# Patient Record
Sex: Female | Born: 1975
Health system: Southern US, Community
[De-identification: ages and names within clinical notes are randomized; demographics above are authoritative.]

## PROBLEM LIST (undated history)

## (undated) DIAGNOSIS — F32A Depression, unspecified: Secondary | ICD-10-CM

## (undated) DIAGNOSIS — F329 Major depressive disorder, single episode, unspecified: Secondary | ICD-10-CM

## (undated) DIAGNOSIS — J45990 Exercise induced bronchospasm: Secondary | ICD-10-CM

## (undated) DIAGNOSIS — F17200 Nicotine dependence, unspecified, uncomplicated: Secondary | ICD-10-CM

## (undated) DIAGNOSIS — M1990 Unspecified osteoarthritis, unspecified site: Secondary | ICD-10-CM

## (undated) DIAGNOSIS — G43109 Migraine with aura, not intractable, without status migrainosus: Secondary | ICD-10-CM

## (undated) DIAGNOSIS — IMO0002 Reserved for concepts with insufficient information to code with codable children: Secondary | ICD-10-CM

## (undated) HISTORY — DX: Nicotine dependence, unspecified, uncomplicated: F17.200

## (undated) HISTORY — DX: Depression, unspecified: F32.A

## (undated) HISTORY — DX: Reserved for concepts with insufficient information to code with codable children: IMO0002

## (undated) HISTORY — DX: Major depressive disorder, single episode, unspecified: F32.9

## (undated) HISTORY — PX: TOTAL HIP ARTHROPLASTY: SHX124

## (undated) HISTORY — DX: Exercise induced bronchospasm: J45.990

## (undated) HISTORY — DX: Migraine with aura, not intractable, without status migrainosus: G43.109

---

## 2010-03-02 ENCOUNTER — Emergency Department (HOSPITAL_COMMUNITY)
Admission: EM | Admit: 2010-03-02 | Discharge: 2010-03-02 | Payer: Self-pay | Source: Home / Self Care | Admitting: Emergency Medicine

## 2010-05-14 LAB — POCT I-STAT, CHEM 8
Chloride: 109 mEq/L (ref 96–112)
Glucose, Bld: 101 mg/dL — ABNORMAL HIGH (ref 70–99)
HCT: 43 % (ref 36.0–46.0)
Potassium: 4 mEq/L (ref 3.5–5.1)

## 2010-05-14 LAB — URINALYSIS, ROUTINE W REFLEX MICROSCOPIC
Bilirubin Urine: NEGATIVE
Nitrite: NEGATIVE
Specific Gravity, Urine: 1.018 (ref 1.005–1.030)
pH: 7.5 (ref 5.0–8.0)

## 2010-05-14 LAB — POCT PREGNANCY, URINE: Preg Test, Ur: NEGATIVE

## 2010-11-28 HISTORY — PX: COLPOSCOPY: SHX161

## 2011-05-02 ENCOUNTER — Emergency Department (HOSPITAL_COMMUNITY)
Admission: EM | Admit: 2011-05-02 | Discharge: 2011-05-02 | Disposition: A | Discharge status: Home / Self Care | Payer: 59 | Attending: Emergency Medicine | Admitting: Emergency Medicine

## 2011-05-02 ENCOUNTER — Encounter (HOSPITAL_COMMUNITY): Payer: Self-pay | Admitting: Emergency Medicine

## 2011-05-02 DIAGNOSIS — S025XXA Fracture of tooth (traumatic), initial encounter for closed fracture: Secondary | ICD-10-CM

## 2011-05-02 DIAGNOSIS — S01501A Unspecified open wound of lip, initial encounter: Secondary | ICD-10-CM | POA: Insufficient documentation

## 2011-05-02 DIAGNOSIS — S01511A Laceration without foreign body of lip, initial encounter: Secondary | ICD-10-CM

## 2011-05-02 DIAGNOSIS — W1809XA Striking against other object with subsequent fall, initial encounter: Secondary | ICD-10-CM | POA: Insufficient documentation

## 2011-05-02 DIAGNOSIS — S01502A Unspecified open wound of oral cavity, initial encounter: Secondary | ICD-10-CM | POA: Insufficient documentation

## 2011-05-02 DIAGNOSIS — S01512A Laceration without foreign body of oral cavity, initial encounter: Secondary | ICD-10-CM

## 2011-05-02 MED ORDER — AMOXICILLIN-POT CLAVULANATE 875-125 MG PO TABS: 1.0000 | ORAL_TABLET | Freq: Once | ORAL | Status: AC

## 2011-05-02 MED ORDER — HYDROCODONE-ACETAMINOPHEN 5-500 MG PO TABS: 1.0000 | ORAL_TABLET | Freq: Four times a day (QID) | ORAL | Status: DC | PRN

## 2011-05-02 MED ORDER — AMOXICILLIN-POT CLAVULANATE 875-125 MG PO TABS
1.0000 | ORAL_TABLET | ORAL | Status: AC
  Administered 2011-05-02: 1 via ORAL
  Filled 2011-05-02: qty 1

## 2011-05-02 MED ORDER — OXYCODONE-ACETAMINOPHEN 5-325 MG PO TABS: 1.0000 | ORAL_TABLET | Freq: Four times a day (QID) | ORAL | Status: AC | PRN
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
## 2011-05-02 NOTE — ED Notes (Signed)
Pt states she understands discharge instructions. Pt cautioned about driving under influence of percocet

## 2011-05-02 NOTE — ED Provider Notes (Signed)
Medical screening examination/treatment/procedure(s) were performed by non-physician practitioner and as supervising physician I was immediately available for consultation/collaboration.   Laray Anger, DO 05/02/11 1629

## 2011-05-02 NOTE — Discharge Instructions (Signed)
Absorbable Suture Repair Absorbable sutures (stitches) hold skin together so you can heal. Keep skin wounds clean and dry for the next 2 to 3 days. Then, you may gently wash your wound and dress it with an antibiotic ointment as recommended. As your wound begins to heal, the sutures are no longer needed, and they typically begin to fall off. This will take 7 to 10 days. After 10 days, if your sutures are loose, you can remove them by wiping with a clean gauze pad or a cotton ball. Do not pull your sutures out. They should wipe away easily. If after 10 days they do not easily wipe away, have your caregiver take them out. Absorbable sutures may be used deep in a wound to help hold it together. If these stitches are below the skin, the body will absorb them completely in 3 to 4 weeks.  You may need a tetanus shot if:  You cannot remember when you had your last tetanus shot.   You have never had a tetanus shot.  If you get a tetanus shot, your arm may swell, get red, and feel warm to the touch. This is common and not a problem. If you need a tetanus shot and you choose not to have one, there is a rare chance of getting tetanus. Sickness from tetanus can be serious. SEEK IMMEDIATE MEDICAL CARE IF:  You have redness in the wound area.   The wound area feels hot to the touch.   You develop swelling in the wound area.   You develop pain.   There is fluid drainage from the wound.  Document Released: 03/28/2004 Document Revised: 10/31/2010 Document Reviewed: 07/10/2010 [facility] Patient Information 2012 [place], [place].Dental Fracture You have a dental fracture or injury. This can mean the tooth is loose, has a chip in the enamel or is broken. If just the outer enamel is chipped, there is a good chance the tooth will not become infected. The only treatment needed may be to smooth off a rough edge. Fractures into the deeper layers (dentin and pulp) cause greater pain and are more likely to become infected.  These require you to see a dentist as soon as possible to save the tooth. Loose teeth may need to be wired or bonded with a plastic splint to hold them in place. A paste may be painted on the open area of the broken tooth to reduce the pain. Antibiotics and pain medicine may be prescribed. Choosing a soft or liquid diet and rinsing the mouth out with warm water after meals may be helpful. See your dentist as recommended. Failure to seek care or follow up with a dentist or other specialist as recommended could result in the loss of your tooth, infection, or permanent dental problems. SEEK MEDICAL CARE IF:   You have increased pain not controlled with medicines.   You have swelling around the tooth, in the face or neck.   You have bleeding which starts, continues, or gets worse.   You have a fever.  Document Released: 03/28/2004 Document Revised: 10/31/2010 Document Reviewed: 01/10/2009 [facility] Patient Information 2012 [place], [place].

## 2012-03-04 DIAGNOSIS — F419 Anxiety disorder, unspecified: Secondary | ICD-10-CM

## 2012-03-04 HISTORY — DX: Anxiety disorder, unspecified: F41.9

## 2012-11-17 ENCOUNTER — Encounter: Payer: Self-pay | Admitting: Gynecology

## 2012-11-18 ENCOUNTER — Encounter: Payer: Self-pay | Admitting: Gynecology

## 2012-11-23 ENCOUNTER — Ambulatory Visit (INDEPENDENT_AMBULATORY_CARE_PROVIDER_SITE_OTHER): Payer: 59 | Admitting: Certified Nurse Midwife

## 2012-11-23 ENCOUNTER — Encounter: Payer: Self-pay | Admitting: Gynecology

## 2012-11-23 ENCOUNTER — Ambulatory Visit: Payer: Self-pay | Admitting: Gynecology

## 2012-11-23 VITALS — BP 110/68 | HR 66 | Resp 14 | Ht 67.25 in | Wt 155.0 lb

## 2012-11-23 DIAGNOSIS — Z8742 Personal history of other diseases of the female genital tract: Secondary | ICD-10-CM

## 2012-11-23 DIAGNOSIS — Z87898 Personal history of other specified conditions: Secondary | ICD-10-CM

## 2012-11-23 DIAGNOSIS — Z0000 Encounter for general adult medical examination without abnormal findings: Secondary | ICD-10-CM

## 2012-11-23 DIAGNOSIS — Z01419 Encounter for gynecological examination (general) (routine) without abnormal findings: Secondary | ICD-10-CM

## 2012-11-23 DIAGNOSIS — R350 Frequency of micturition: Secondary | ICD-10-CM

## 2012-11-23 LAB — POCT URINALYSIS DIPSTICK: Urobilinogen, UA: NEGATIVE

## 2012-11-23 NOTE — Patient Instructions (Signed)

## 2012-11-23 NOTE — Progress Notes (Signed)
37 y.o. [id number] Single Hispanic Fe here for annual exam. Periods normal, no issues. Currently not sexual active. Desires STD screening from last partner. No health issues today. Being treated for bulging discs at present. Complaining of urinary frequency that has increased over the past few months. Denies pain with urination. 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 Also complaining of bump that reoccurs with questionable blister appearance in pubic area. Becomes sore and tender and burns, not present today. No history of herpes that she is aware of. Sees PCP for aex and labs. No other health issues today.  Patient's last menstrual period was 11/10/2012.          Sexually active: yes  The current method of family planning is condoms most of the time.    Exercising: yes  walking regularly Smoker:  no  Health Maintenance: Pap:  11/01/11 abnormal pap LSIL with colpo CIN1 MMG:  none Colonoscopy:  none BMD:   none TDaP:  04/23/2010 Labs: PCP ; Urine: Leuks 2   reports that she has never smoked. She has never used smokeless tobacco. She reports that  drinks alcohol. She reports that she does not use illicit drugs.  Past Medical History  Diagnosis Date  . Migraine with aura   . ASCUS with positive high risk HPV   . Depression   . Smoker     Past Surgical History  Procedure Laterality Date  . Colposcopy  11/28/10    With hx CIN I     Current Outpatient Prescriptions  Medication Sig Dispense Refill  . BIOTIN PO Take by mouth.      . meloxicam (MOBIC) 15 MG tablet Take 15 mg by mouth daily.      . Multiple Vitamins-Minerals (MULTIVITAMIN WITH MINERALS) tablet Take 1 tablet by mouth daily.      Marland Kitchen MELATONIN PO Take by mouth as needed.        No current facility-administered medications for this visit.    Family History  Problem Relation Age of Onset  . Migraines Maternal Grandmother   . Heart disease Maternal Grandmother   . Hodgkin's lymphoma Maternal Grandfather     ROS:   Pertinent items are noted in HPI.  Otherwise, a comprehensive ROS was negative.  Exam:   BP 110/68  Pulse 66  Resp 14  Ht 5' 7.25" (1.708 m)  Wt 155 lb (70.308 kg)  BMI 24.1 kg/m2  LMP 11/10/2012 Height: 5' 7.25" (170.8 cm)  Ht Readings from Last 3 Encounters:  11/23/12 5' 7.25" (1.708 m)    General appearance: alert, cooperative and appears stated age Head: Normocephalic, without obvious abnormality, atraumatic Neck: no adenopathy, supple, symmetrical, trachea midline and thyroid normal to inspection and palpation Lungs: clear to auscultation bilaterally, CVAT negative Breasts: normal appearance, no masses or tenderness, No nipple retraction or dimpling, No nipple discharge or bleeding, No axillary or supraclavicular adenopathy Heart: regular rate and rhythm Abdomen: soft, non-tender; no masses,  no organomegaly, negative suprapubic Extremities: extremities normal, atraumatic, no cyanosis or edema Skin: Skin color, texture, turgor normal. No rashes or lesions Lymph nodes: Cervical, supraclavicular, and axillary nodes normal. No abnormal inguinal nodes palpated Neurologic: Grossly normal   Pelvic: External genitalia:  no lesions or blisters noted              Urethra:  normal appearing urethra with no masses, tenderness or lesions, bladder non tender              Bartholin's and Skene's: normal  Vagina: normal appearing vagina with normal color and discharge, no lesions              Cervix: normal, non tender              Pap taken: yes Bimanual Exam:  Uterus:  normal size, contour, position, consistency, mobility, non-tender and anteflexed              Adnexa: normal adnexa and no mass, fullness, tenderness               Rectovaginal: Confirms               Anus:  normal sphincter tone, no lesions  A:  Well Woman with normal exam  Contraception: condoms  History of abnormal Pap smear LSIL/colpo/CIN1 follow up pap today  STD screening  Urinary frequency    Questionable Herpes blister vs ingrown hair  P:   Reviewed health and wellness pertinent to exam  Stressed consistent use  Stressed aex. If pap negative will need repeat next year, if not negative will manage per findings  Lab:GC,Chlamydia,HIV,RPR,HSV1,11   Encourage water intake daily, given warning sign of UTI, Lab: Urine microscopic, culture  Recommended OV when present when occurs  Pap smear as per guidelines   Mammogram yearly at age 30 pap smear taken today with HPVHR  counseled on breast self exam, mammography screening, adequate intake of calcium and vitamin D, diet and exercise, Kegel's exercises  return annually or prn  An After Visit Summary was printed and given to the patient.

## 2012-11-24 LAB — URINALYSIS, MICROSCOPIC ONLY: Crystals: NONE SEEN

## 2012-11-24 NOTE — Progress Notes (Signed)
Note reviewed, agree with plan.  Ashland Wiseman, MD  

## 2012-11-25 LAB — IPS PAP TEST WITH HPV

## 2012-11-25 LAB — IPS N GONORRHOEA AND CHLAMYDIA BY PCR

## 2012-11-26 ENCOUNTER — Telehealth: Payer: Self-pay | Admitting: Gynecology

## 2012-11-26 ENCOUNTER — Other Ambulatory Visit: Payer: Self-pay | Admitting: Certified Nurse Midwife

## 2012-11-26 DIAGNOSIS — IMO0001 Reserved for inherently not codable concepts without codable children: Secondary | ICD-10-CM

## 2012-12-01 ENCOUNTER — Telehealth: Payer: Self-pay | Admitting: Orthopedic Surgery

## 2012-12-01 NOTE — Telephone Encounter (Signed)
If she is already on an NSAID she should be fine. Be sure she eats and drinks something prior to appointment. Tylenol would be OK also

## 2012-12-01 NOTE — Telephone Encounter (Signed)
LMTCB for results. aa 

## 2012-12-01 NOTE — Telephone Encounter (Signed)
Spoke with pt about Pap results and need for colposcopy. Pt familiar with procedure and agreeable. Advised OOP cost will be $361.59. Pt agreeable. Pt also inquired about other lab results from last visit, specifically HSV. 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 Pt agreeable to talk with DL at appt about options for treatment. Advised pt to take ibuprofen an hour before appt, and pt reported she cannot take ibuprofen due to another anti-inflammatory med that she takes. Pt wondering if there is something else she should take prior to appt, as the last colpo was "pretty uncomfortable." Any advice?

## 2012-12-04 ENCOUNTER — Ambulatory Visit (INDEPENDENT_AMBULATORY_CARE_PROVIDER_SITE_OTHER): Payer: 59 | Admitting: Certified Nurse Midwife

## 2012-12-04 ENCOUNTER — Telehealth: Payer: Self-pay | Admitting: Orthopedic Surgery

## 2012-12-04 VITALS — BP 104/66 | HR 64 | Resp 16 | Ht 67.25 in | Wt 156.0 lb

## 2012-12-04 DIAGNOSIS — Z789 Other specified health status: Secondary | ICD-10-CM

## 2012-12-04 DIAGNOSIS — IMO0001 Reserved for inherently not codable concepts without codable children: Secondary | ICD-10-CM

## 2012-12-04 DIAGNOSIS — Z308 Encounter for other contraceptive management: Secondary | ICD-10-CM

## 2012-12-04 DIAGNOSIS — R6889 Other general symptoms and signs: Secondary | ICD-10-CM

## 2012-12-04 MED ORDER — VALACYCLOVIR HCL 500 MG PO TABS: 500.0000 mg | ORAL_TABLET | Freq: Every day | ORAL | Status: DC

## 2012-12-04 NOTE — Progress Notes (Signed)
Patient ID: Michelle Ewing, female   DOB: 1975/10/21, 37 y.o.   MRN: [id number]  Chief Complaint  Patient presents with  . Colposcopy    HPI Michelle Ewing is a 37 y.o. female.  Here for colposcopy . Denies vaginal discharge or bleeding. Not sexually active at present. LMP 11/10/12. Also here for discussion of HSV2 treatment due to recent positive serum results and recent outbreak in [place] area. No other health issues. HPI  Indications: Pap smear on November 23, 2012 showed: ASCUS with NEGATIVE high risk HPV. Previous colposcopy: CIN 1 and in 11/18/11. Prior cervical treatment: no treatment.  Past Medical History  Diagnosis Date  . Migraine with aura   . ASCUS with positive high risk HPV   . Depression   . Smoker     Past Surgical History  Procedure Laterality Date  . Colposcopy  11/28/10    With hx CIN I     Family History  Problem Relation Age of Onset  . Migraines Maternal Grandmother   . Heart disease Maternal Grandmother   . Hodgkin's lymphoma Maternal Grandfather     Social History History  Substance Use Topics  . Smoking status: Never Smoker   . Smokeless tobacco: Never Used  . Alcohol Use: Yes     Comment: occ    No Known Allergies  Current Outpatient Prescriptions  Medication Sig Dispense Refill  . BIOTIN PO Take by mouth daily.       Marland Kitchen MELATONIN PO Take by mouth as needed.       . meloxicam (MOBIC) 15 MG tablet Take 15 mg by mouth daily.      . Multiple Vitamins-Minerals (MULTIVITAMIN WITH MINERALS) tablet Take 1 tablet by mouth daily.       No current facility-administered medications for this visit.    Review of Systems Review of Systems  Constitutional: Negative.   Genitourinary: Negative for vaginal bleeding, vaginal discharge and vaginal pain.    Blood pressure 104/66, pulse 64, resp. rate 16, height 5' 7.25" (1.708 m), weight 156 lb (70.761 kg), last menstrual period 11/10/2012.  Physical Exam Physical Exam  Constitutional: She is oriented to  person, place, and time. She appears well-developed and well-nourished.  Genitourinary: Vagina normal and uterus normal.    Neurological: She is alert and oriented to person, place, and time.  Skin: Skin is warm and dry.  Psychiatric: She has a normal mood and affect.    Data Reviewed Reviewed Pap smear results with patient and discussed HSV 2 results. Discussed suppression medication use for frequent outbreak. Risks and benefits given. Etiology of transmission given requests Rx for HSV2.  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  Speculum placed in vagina and excellent visualization of cervix achieved, cervix swabbed x 3 with acetic acid solution. Cervix viewed with 3.75,7.5, 15 # and green filter with acetowhite effect noted at 9 o'clock. Lugol's solution applied with same area noted with non staining. Biopsy taken from same area. ECC obtained. Monsel's applied to area, no active bleeding noted on speculum removal. Patient tolerated procedure well. Specimens: 2  Complications: none.     Plan  Instructions given to patient. Rx Valtrex see order with instructions given to patient  Patient will keep record of outbreak history over the next few months. Encouraged good diet with folic acid which has shown in some studies to  increase immune status.  Specimens labelled and sent to Pathology. Patient will be notified of results when received.      LEONARD,DEBORAH 12/04/2012, 2:36 PM

## 2012-12-04 NOTE — Progress Notes (Signed)
Hx of ASCUS HPV not detected on pap 11/23/12 CIN1 on previous colpo 11-28-10

## 2012-12-04 NOTE — Telephone Encounter (Signed)
Spoke with pt to advise that she can take Tylenol today prior to her appt for colpo. Pt instructed to eat and drink before coming in as well. Pt agreeable.

## 2012-12-04 NOTE — Patient Instructions (Addendum)

## 2012-12-08 NOTE — Progress Notes (Signed)
(  A) CERVIX, "9:00", BIOPSY: -MILD CYTOLOGIC ATYPIA, CONSISTENT WITH LOW GRADE SQUAMOUS INTRAEPITHELIAL LESION (B) ENDOCERVIX, CURETTINGS: -MUCOID DEBRIS WITH BENIGN ENDOCERVICAL GLANDULAR EPITHELIAL CELLS; NEGATIVE FOR ATYPIA OR MALIGNANCY   Note reviewed, agree with plan.  Douglass Rivers, MD

## 2013-01-07 ENCOUNTER — Other Ambulatory Visit: Payer: Self-pay

## 2013-12-07 ENCOUNTER — Ambulatory Visit: Payer: 59 | Admitting: Certified Nurse Midwife

## 2013-12-09 ENCOUNTER — Other Ambulatory Visit: Payer: Self-pay | Admitting: Certified Nurse Midwife

## 2013-12-09 NOTE — Telephone Encounter (Signed)
Last AEX: 11/23/12 Last refill: 12/04/12 #30 X 12 Current AEX:12/15/13  Please advise

## 2013-12-15 ENCOUNTER — Ambulatory Visit: Payer: 59 | Admitting: Certified Nurse Midwife

## 2014-01-03 ENCOUNTER — Encounter: Payer: Self-pay | Admitting: Gynecology

## 2014-01-11 ENCOUNTER — Ambulatory Visit: Payer: 59 | Admitting: Certified Nurse Midwife

## 2014-02-22 ENCOUNTER — Ambulatory Visit (INDEPENDENT_AMBULATORY_CARE_PROVIDER_SITE_OTHER): Payer: PRIVATE HEALTH INSURANCE | Admitting: Certified Nurse Midwife

## 2014-02-22 ENCOUNTER — Encounter: Payer: Self-pay | Admitting: Certified Nurse Midwife

## 2014-02-22 VITALS — BP 108/62 | HR 68 | Resp 16 | Ht 67.25 in | Wt 160.0 lb

## 2014-02-22 DIAGNOSIS — Z01419 Encounter for gynecological examination (general) (routine) without abnormal findings: Secondary | ICD-10-CM

## 2014-02-22 DIAGNOSIS — Z0000 Encounter for general adult medical examination without abnormal findings: Secondary | ICD-10-CM

## 2014-02-22 DIAGNOSIS — N943 Premenstrual tension syndrome: Secondary | ICD-10-CM

## 2014-02-22 DIAGNOSIS — B373 Candidiasis of vulva and vagina: Secondary | ICD-10-CM

## 2014-02-22 DIAGNOSIS — Z124 Encounter for screening for malignant neoplasm of cervix: Secondary | ICD-10-CM

## 2014-02-22 DIAGNOSIS — B3731 Acute candidiasis of vulva and vagina: Secondary | ICD-10-CM

## 2014-02-22 LAB — POCT URINALYSIS DIPSTICK
Bilirubin, UA: NEGATIVE
Blood, UA: NEGATIVE
GLUCOSE UA: NEGATIVE
Ketones, UA: NEGATIVE
Leukocytes, UA: NEGATIVE
NITRITE UA: NEGATIVE
PROTEIN UA: NEGATIVE
UROBILINOGEN UA: NEGATIVE
pH, UA: 5

## 2014-02-22 MED ORDER — NYSTATIN 100000 UNIT/GM EX CREA: 1.0000 "application " | TOPICAL_CREAM | Freq: Two times a day (BID) | CUTANEOUS | Status: DC

## 2014-02-22 NOTE — Progress Notes (Signed)
38 y.o. [id number] Single Caucasian Fe here for annual exam. Periods normal, no issues. Contraception is abstinence. No STD screening. Complaining of area on external vulva area on right that causes periodic itching. Works out hard with exercise and is changing out of clothes sooner now and has changed to [employer] soap. No HSV outbreaks in the past 6 months. Has stopped suppression use now. Patient has noticed increase PMS since she turned 38 with mainly irritability until menses starts, then resolves. Craving sweet/salty foods. Drinking very little water also during that time. Suggestions?? Sees Urgent care if needed. No other health issues today.  Patient's last menstrual period was 02/17/2014.          Sexually active: Yes.    Not in past 4 months The current method of family planning is abstinence.    Exercising: Yes.    gym Smoker:  no  Health Maintenance: Pap:  11-23-12 ASCUS HPV HRneg, colpo 12-04-12 mild atypia consistent with LGSIL MMG:  none Colonoscopy:  none BMD:   none TDaP:  2012 Labs: Poct urine-neg Self breast exam: done occ   reports that she has never smoked. She has never used smokeless tobacco. She reports that she drinks about 2.4 - 3.0 oz of alcohol per week. She reports that she does not use illicit drugs.  Past Medical History  Diagnosis Date  . Migraine with aura   . ASCUS with positive high risk HPV   . Depression   . Smoker     Past Surgical History  Procedure Laterality Date  . Colposcopy  11/28/10    With hx CIN I     Current Outpatient Prescriptions  Medication Sig Dispense Refill  . BIOTIN PO Take by mouth daily.     . folic acid (FOLVITE) 400 MCG tablet Take 400 mcg by mouth daily.    Chilton Si. Green Tea, Camillia sinensis, (GREEN TEA EXTRACT PO) Take 315 mg by mouth daily.    . Multiple Vitamins-Minerals (MULTIVITAMIN WITH MINERALS) tablet Take 1 tablet by mouth daily.    Marland Kitchen. PROAIR HFA 108 (90 BASE) MCG/ACT inhaler   2  . valACYclovir (VALTREX) 500 MG tablet TAKE  1 TABLET (500 MG TOTAL) BY MOUTH DAILY. INCREASE TO TWICE A DAY X 3 DAYS AT ONSET 30 tablet 0   No current facility-administered medications for this visit.    Family History  Problem Relation Age of Onset  . Migraines Maternal Grandmother   . Heart disease Maternal Grandmother   . Hodgkin's lymphoma Maternal Grandfather     ROS:  Pertinent items are noted in HPI.  Otherwise, a comprehensive ROS was negative.  Exam:   BP 108/62 mmHg  Pulse 68  Resp 16  Ht 5' 7.25" (1.708 m)  Wt 160 lb (72.576 kg)  BMI 24.88 kg/m2  LMP 02/17/2014 Height: 5' 7.25" (170.8 cm)  Ht Readings from Last 3 Encounters:  02/22/14 5' 7.25" (1.708 m)  12/04/12 5' 7.25" (1.708 m)  11/23/12 5' 7.25" (1.708 m)    General appearance: alert, cooperative and appears stated age Head: Normocephalic, without obvious abnormality, atraumatic Neck: no adenopathy, supple, symmetrical, trachea midline and thyroid normal to inspection and palpation Lungs: clear to auscultation bilaterally Breasts: normal appearance, no masses or tenderness, No nipple retraction or dimpling, No nipple discharge or bleeding, No axillary or supraclavicular adenopathy Heart: regular rate and rhythm Abdomen: soft, non-tender; no masses,  no organomegaly Extremities: extremities normal, atraumatic, no cyanosis or edema Skin: Skin color, texture, turgor normal. No rashes or  lesions Lymph nodes: Cervical, supraclavicular, and axillary nodes normal. No abnormal inguinal nodes palpated Neurologic: Grossly normal   Pelvic: External genitalia:  no lesions, right labia red, slight scaling, no exudate wet prep taken,left labia WNL,               Urethra:  normal appearing urethra with no masses, tenderness or lesions              Bartholin's and Skene's: normal                 Vagina: normal appearing vagina with normal color and discharge, no lesions, no odor              Cervix: normal,non tender, no lesions              Pap taken: Yes.    Bimanual Exam:  Uterus:  normal size, contour, position, consistency, mobility, non-tender and anteverted              Adnexa: normal adnexa and no mass, fullness, tenderness               Rectovaginal: Confirms               Anus:  normal sphincter tone, no lesions  Wet prep positive for yeast  A:  Well Woman with normal exam  Contraception none needed  History of abnormal pap LSIL on colpo repeat pap today. If negative repeat pap one year, if not per results.  PMS  Yeast dermatitis     P: Reviewed health and wellness pertinent to exam  Discussed increasing water intake, decreasing sugar and salt during time prior to period to decrease fluid retention and nervous irritability. Start on B complex one week before symptoms usually start. Stop once period start. Questions addressed.  Reviewed findings of yeast,which may be coming from work out perspiration. Decrease time in wet clothes and can apply moisturizer to area for protection. Questions addressed. Aveeno sitz bath for comfort  Rx Nystatin cream see order.  Pap smear taken today with HPVHR   counseled on breast self exam, mammography screening discussed and can begin now at 38, given information to schedule, STD prevention, HIV risk factors and prevention, adequate intake of calcium and vitamin D, diet and exercise  return annually or prn  An After Visit Summary was printed and given to the patient.

## 2014-02-22 NOTE — Patient Instructions (Addendum)
EXERCISE AND DIET:  We recommended that you start or continue a regular exercise program for good health. Regular exercise means any activity that makes your heart beat faster and makes you sweat.  We recommend exercising at least 30 minutes per day at least 3 days a week, preferably 4 or 5.  We also recommend a diet low in fat and sugar.  Inactivity, poor dietary choices and obesity can cause diabetes, heart attack, stroke, and kidney damage, among others.    ALCOHOL AND SMOKING:  Women should limit their alcohol intake to no more than 7 drinks/beers/glasses of wine (combined, not each!) per week. Moderation of alcohol intake to this level decreases your risk of breast cancer and liver damage. And of course, no recreational drugs are part of a healthy lifestyle.  And absolutely no smoking or even second hand smoke. Most people know smoking can cause heart and lung diseases, but did you know it also contributes to weakening of your bones? Aging of your skin?  Yellowing of your teeth and nails?  CALCIUM AND VITAMIN D:  Adequate intake of calcium and Vitamin D are recommended.  The recommendations for exact amounts of these supplements seem to change often, but generally speaking 600 mg of calcium (either carbonate or citrate) and 800 units of Vitamin D per day seems prudent. Certain women may benefit from higher intake of Vitamin D.  If you are among these women, your doctor will have told you during your visit.    PAP SMEARS:  Pap smears, to check for cervical cancer or precancers,  have traditionally been done yearly, although recent scientific advances have shown that most women can have pap smears less often.  However, every woman still should have a physical exam from her gynecologist every year. It will include a breast check, inspection of the vulva and vagina to check for abnormal growths or skin changes, a visual exam of the cervix, and then an exam to evaluate the size and shape of the uterus and  ovaries.  And after 40 years of age, a rectal exam is indicated to check for rectal cancers. We will also provide age appropriate advice regarding health maintenance, like when you should have certain vaccines, screening for sexually transmitted diseases, bone density testing, colonoscopy, mammograms, etc.   MAMMOGRAMS:  All women over 40 years old should have a yearly mammogram. Many facilities now offer a "3D" mammogram, which may cost around $50 extra out of pocket. If possible,  we recommend you accept the option to have the 3D mammogram performed.  It both reduces the number of women who will be called back for extra views which then turn out to be normal, and it is better than the routine mammogram at detecting truly abnormal areas.    COLONOSCOPY:  Colonoscopy to screen for colon cancer is recommended for all women at age 50.  We know, you hate the idea of the prep.  We agree, BUT, having colon cancer and not knowing it is worse!!  Colon cancer so often starts as a polyp that can be seen and removed at colonscopy, which can quite literally save your life!  And if your first colonoscopy is normal and you have no family history of colon cancer, most women don't have to have it again for 10 years.  Once every ten years, you can do something that may end up saving your life, right?  We will be happy to help you get it scheduled when you are ready.    Be sure to check your insurance coverage so you understand how much it will cost.  It may be covered as a preventative service at no cost, but you should check your particular policy.     Premenstrual Syndrome Premenstrual syndrome (PMS) is a condition that consists of physical, emotional, and behavioral symptoms that affect women of childbearing age. PMS occurs 5-14 days before the start of a menstrual period and often recurs in a predictable pattern. The symptoms go away a few days after the menstrual period starts. PMS can interfere in many ways with normal  daily activities and can range from mild to severe. When PMS is considered severe, it may be diagnosed as premenstrual dysphoric disorder (PMDD). A small percentage of women are affected by PMS symptoms and an even smaller percentage of those women are affected by PMDD.  CAUSES  The exact cause of PMS is unknown, but it seems to be related to cyclic hormone changes that happen before menstruation. These hormones are thought to affect chemicals in the brain (serotonin) that can influence a person's mood.  SYMPTOMS  Symptoms of PMS recur consistently from month to month and go away completely after the menstrual period starts. The most common emotional or behavioral symptom is mood swings. These mood swings can be disabling and interfere with normal activities of daily living. Other common symptoms include depression and angry outbursts. Other symptoms may include:   Irritability.  Anxiety.  Crying spells.   Food cravings or appetite changes.   Changes in sexual desire.   Confusion.   Aggression.   Social withdrawal.   Poor concentration. The most common physical symptoms include a sense of bloating, breast pain, headaches, and extreme fatigue. Other physical symptoms include:   Backaches.   Swelling of the hands and feet.   Weight gain.   Hot flashes.  DIAGNOSIS  To make a diagnosis, your caregiver will ask questions to confirm that you are having a pattern of symptoms. Symptoms must:   Be present 5 days before the start of your period and be present at least 3 months in a row.   End within 4 days after your period starts.   Interfere with some of your normal activities.  Other conditions, such as thyroid disease, depression, and migraine headaches must be ruled out before a diagnosis of PMS is confirmed.  TREATMENT  Your caregiver may suggest ways to maintain a healthy lifestyle, such as exercise. Over-the-counter pain relievers may ease cramps, aches, pains,  headaches, and breast tenderness. However, selective serotonin reuptake inhibitors (SSRIs) are medicines that are most beneficial in improving PMS if taken in the second half of the monthly cycle. They may be taken on a daily basis. The most effective oral contraceptive pill used for symptoms of PMS is one that contains the ingredient drospirenone. Taking 4 days off of the pill instead of the usual 7 days also has shown to increase effectiveness.  There are a number of drugs, dietary supplements, vitamins, and water pills (diuretics) which have been suggested to be helpful but have not shown to be of any benefit to improving PMS symptoms.  HOME CARE INSTRUCTIONS   For 2-3 months, write down your symptoms, their severity, and how long they last. This may help your caregiver prescribe the best treatment for your symptoms.  Exercise regularly as suggested by your caregiver.  Eat a regular, well-balanced diet.  Avoid caffeine, alcohol, and tobacco consumption.  Limit salt and salty foods to lessen bloating and fluid  retention.  Get enough sleep. Practice relaxation techniques.  Drink enough fluids to keep your urine clear or pale yellow.  Take medicines as directed by your caregiver.  Limit stress.  Take a multivitamin as directed by your caregiver. Document Released: 02/16/2000 Document Revised: 11/13/2011 Document Reviewed: 07/08/2011 Exit[employer] Patient Information 2015 [employer]. This information is not intended to replace advice given to you by your health care provider. Make sure you discuss any questions you have with your health care provider.  

## 2014-02-23 NOTE — Progress Notes (Signed)
Reviewed personally.  M. Suzanne Nancy Manuele, MD.  

## 2014-02-24 LAB — IPS PAP TEST WITH HPV

## 2014-02-27 ENCOUNTER — Other Ambulatory Visit: Payer: Self-pay | Admitting: Certified Nurse Midwife

## 2014-02-28 NOTE — Telephone Encounter (Signed)
Medication refill request: Valtrex 500 mg  Last AEX:  02/22/14 with Ms. Debbie Next AEX: no AEX scheduled for 2016 Last MMG (if hormonal medication request): N/A  Refill authorized: #[id number] rfs, please advise.  [facility] of office today.

## 2014-08-03 ENCOUNTER — Ambulatory Visit (INDEPENDENT_AMBULATORY_CARE_PROVIDER_SITE_OTHER): Payer: 59 | Admitting: Certified Nurse Midwife

## 2014-08-03 ENCOUNTER — Other Ambulatory Visit: Payer: Self-pay

## 2014-08-03 ENCOUNTER — Encounter: Payer: Self-pay | Admitting: Certified Nurse Midwife

## 2014-08-03 VITALS — BP 104/68 | HR 72 | Resp 20 | Ht 67.25 in | Wt 162.0 lb

## 2014-08-03 DIAGNOSIS — Z3009 Encounter for other general counseling and advice on contraception: Secondary | ICD-10-CM

## 2014-08-03 DIAGNOSIS — Z1231 Encounter for screening mammogram for malignant neoplasm of breast: Secondary | ICD-10-CM

## 2014-08-03 DIAGNOSIS — N762 Acute vulvitis: Secondary | ICD-10-CM

## 2014-08-03 MED ORDER — CLOBETASOL PROPIONATE 0.05 % EX OINT
TOPICAL_OINTMENT | CUTANEOUS | Status: DC
Start: 2014-08-03 — End: 2014-08-15

## 2014-08-03 NOTE — Progress Notes (Signed)
38 y.o.Single white [id number] here with complaint of right vulva itching and redness and slight watery discharge from a scratch area. Treated with Nystatin in past for yeast vulvitis with good results, but patient feels this is different. 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 Has changed laundry detergent recently also. No STD concerns. Patient used some antibiotic ointment on area and felt slightly better, but then returned. Shaves area once weekly also. Also question about scheduling for tubal ligation. No other health issues today.   O:Healthy female WDWN Affect: normal, orientation x 3  Exam: Abdomen:soft,non tender Lymph node: no enlargement or tenderness in inguinal area Pelvic exam: External genital: normal female, right vulva noted red with questionable HSV lesion noted on upper area of vulva, no pustule, small papules noted below same, no blister appearance, slightly tender to touch. HSV and MRSA cultures taken. Wet prep taken. Left vulva normal appearance BUS: negative Vagina:  White non odorous discharge noted. Ph: 4.0  ,Wet prep taken, Cervix: normal, non tender, no CMT Uterus: normal, non tender Adnexa:normal, non tender, no masses or fullness noted   Wet Prep results:vulva/vagina negative for pathogens   A:Normal pelvic exam Vulvitis ? Contact dermatitis vs HSV 2 out break, R/O MRSA Sterilization information desired   P:Discussed findings of negative wet prep. Discussed ? HSV appearance and aware she has HSV culture would be beneficial. Continue Valtrex but increase 1000 mg x 3 days. Discussed etiology of MRSA and due to redness feel this important to make sure not present. Patient agreeable. Discussed Aveeno or baking soda sitz bath for comfort.  Rx Clobetasol see order with instructions limit use to 5-7 days only.  Recheck 1 week if not resolved  Discussed will need to schedule appointment with Dr. Hyacinth MeekerMiller  for consult regarding surgery and then would be scheduled and insurance pre-certed. Patient will schedule. Has information regarding sterilization..  Rv as above, prn

## 2014-08-05 NOTE — Progress Notes (Signed)
Reviewed personally.  M. Suzanne Shelbi Vaccaro, MD.  

## 2014-08-07 LAB — MRSA CULTURE

## 2014-08-08 ENCOUNTER — Ambulatory Visit
Admission: RE | Admit: 2014-08-08 | Discharge: 2014-08-08 | Disposition: A | Discharge status: Home / Self Care | Payer: 59 | Source: Ambulatory Visit

## 2014-08-08 DIAGNOSIS — Z1231 Encounter for screening mammogram for malignant neoplasm of breast: Secondary | ICD-10-CM

## 2014-08-08 LAB — HERPES SIMPLEX VIRUS CULTURE: ORGANISM ID, BACTERIA: NOT DETECTED

## 2014-08-10 ENCOUNTER — Other Ambulatory Visit: Payer: Self-pay | Admitting: Family Medicine

## 2014-08-10 DIAGNOSIS — R928 Other abnormal and inconclusive findings on diagnostic imaging of breast: Secondary | ICD-10-CM

## 2014-08-12 ENCOUNTER — Ambulatory Visit
Admission: RE | Admit: 2014-08-12 | Discharge: 2014-08-12 | Disposition: A | Discharge status: Home / Self Care | Payer: 59 | Source: Ambulatory Visit | Attending: Family Medicine | Admitting: Family Medicine

## 2014-08-12 DIAGNOSIS — R928 Other abnormal and inconclusive findings on diagnostic imaging of breast: Secondary | ICD-10-CM

## 2014-08-15 ENCOUNTER — Ambulatory Visit (INDEPENDENT_AMBULATORY_CARE_PROVIDER_SITE_OTHER): Payer: 59 | Admitting: Certified Nurse Midwife

## 2014-08-15 ENCOUNTER — Encounter: Payer: Self-pay | Admitting: Certified Nurse Midwife

## 2014-08-15 VITALS — BP 110/74 | HR 70 | Resp 16 | Ht 67.25 in | Wt 164.0 lb

## 2014-08-15 DIAGNOSIS — N762 Acute vulvitis: Secondary | ICD-10-CM

## 2014-08-15 NOTE — Progress Notes (Signed)
39 y.o. Single Caucasian female [id number] here for follow up of vulva dermatitis treated with Clobetasol initiated on 08/03/14. Completed all medication as directed for bid x 5 days. Patient noticed great improvement after 3 days. Denies any itching, redness, tear or scratched area now. Denies any pain or other problems.  So happy with this results!!! No other concerns.    O: Healthy WD,WN female Affect: normal  Pelvic exam:EXTERNAL GENITALIA: normal appearing vulva with no masses, tenderness or lesions, no redness or edema on right vulva now, normal appearance VAGINA: no abnormal discharge or lesions  A:Vulvitis resolved  P: Discussed findings of normal appearance and no need for treatment continuation. Avoid new products as discussed at previous visit and Dove soap for cleansing area.  RV prn

## 2014-08-15 NOTE — Progress Notes (Signed)
Reviewed personally.  M. Suzanne Natiya Seelinger, MD.  

## 2014-09-08 ENCOUNTER — Telehealth: Payer: Self-pay | Admitting: Emergency Medicine

## 2014-09-08 NOTE — Telephone Encounter (Signed)
Out of hold per Dr. Silva   

## 2014-09-08 NOTE — Telephone Encounter (Signed)
-----   Message from Patton SallesBrook E Amundson C Silva, MD sent at 09/07/2014 10:12 PM EDT ----- Regarding: RE: Mammogram hold  This is Dr. Edward JollySilva reviewing Dr. Rondel BatonMiller's in box.   Ok to remove from mammogram hold and return to routine screening mammogram.  ----- Message -----    From: Joeseph Amorracy L Plummer Matich, RN    Sent: 09/06/2014   3:41 PM      To: Jerene BearsMary S Miller, MD Subject: Mammogram hold                                 Dr. Hyacinth MeekerMiller,  DL patient in hold from recall from screening. Repeat imaging completed and forwarded to another provider. Okay to remove from hold?

## 2015-03-03 ENCOUNTER — Ambulatory Visit (INDEPENDENT_AMBULATORY_CARE_PROVIDER_SITE_OTHER): Payer: 59 | Admitting: Certified Nurse Midwife

## 2015-03-03 ENCOUNTER — Encounter: Payer: Self-pay | Admitting: Certified Nurse Midwife

## 2015-03-03 VITALS — BP 108/64 | HR 70 | Resp 16 | Ht 67.5 in | Wt 165.0 lb

## 2015-03-03 DIAGNOSIS — Z0000 Encounter for general adult medical examination without abnormal findings: Secondary | ICD-10-CM

## 2015-03-03 DIAGNOSIS — Z01419 Encounter for gynecological examination (general) (routine) without abnormal findings: Secondary | ICD-10-CM

## 2015-03-03 DIAGNOSIS — Z124 Encounter for screening for malignant neoplasm of cervix: Secondary | ICD-10-CM | POA: Diagnosis not present

## 2015-03-03 LAB — POCT URINALYSIS DIPSTICK
Bilirubin, UA: NEGATIVE
Blood, UA: NEGATIVE
Glucose, UA: NEGATIVE
Ketones, UA: NEGATIVE
LEUKOCYTES UA: NEGATIVE
NITRITE UA: NEGATIVE
PH UA: 5
PROTEIN UA: NEGATIVE
Urobilinogen, UA: NEGATIVE

## 2015-03-03 LAB — HEMOGLOBIN, FINGERSTICK: Hemoglobin, fingerstick: 12.9 g/dL (ref 12.0–16.0)

## 2015-03-03 NOTE — Progress Notes (Signed)
39 y.o. [id number] Single  Caucasian Fe here for annual exam. Periods normal, no issues. Not sexually active. Patient has noted stool changes with increase in gas and discomfort. Stools hard and small now. Has increased water and roughage in diet, with no change. Denies any blood in stool, some on tissue, no pain. No history of hemorrhoids. Has not tried stool softener. Sees PCP if needed. Continues with occasional stress incontinence, but no issues. No other health issues today.  Patient's last menstrual period was 02/18/2015.          Sexually active: No.  The current method of family planning is abstinence.    Exercising: No.  exercise Smoker:  no  Health Maintenance: Pap: 02-22-14 neg HPV HR neg, hx of abnormal with colpo 2014 MMG: 08-12-14 category c density, birads 2:neg Colonoscopy:  none BMD:   none TDaP:  2012 Shingles: no Pneumonia: no Hep C and HIV: HIV neg 2014, Hep C not done requests Labs: poct urine-neg, Hgb-12.9 Self breast exam: done occ   reports that she has quit smoking. She has never used smokeless tobacco. She reports that she drinks about 2.4 - 3.0 oz of alcohol per week. She reports that she does not use illicit drugs.  Past Medical History  Diagnosis Date  . Migraine with aura   . ASCUS with positive high risk HPV   . Depression   . Smoker   . Exercise-induced asthma     Past Surgical History  Procedure Laterality Date  . Colposcopy  11/28/10    With hx CIN , 2014 LGSIL    Current Outpatient Prescriptions  Medication Sig Dispense Refill  . BIOTIN PO Take by mouth daily.     . folic acid (FOLVITE) 400 MCG tablet Take 400 mcg by mouth daily.    . Multiple Vitamins-Minerals (MULTIVITAMIN WITH MINERALS) tablet Take 1 tablet by mouth daily.    . SUMAtriptan (IMITREX) 50 MG tablet   1  . TURMERIC PO Take by mouth daily.    . valACYclovir (VALTREX) 500 MG tablet TAKE 1 TABLET DAILY, INCREASE TO TWICE A DAY FOR 3 DAYS AT ONSET 60 tablet 12   No current  facility-administered medications for this visit.    Family History  Problem Relation Age of Onset  . Migraines Maternal Grandmother   . Heart disease Maternal Grandmother   . Hodgkin's lymphoma Maternal Grandfather     ROS:  Pertinent items are noted in HPI.  Otherwise, a comprehensive ROS was negative.  Exam:   BP 108/64 mmHg  Pulse 70  Resp 16  Ht 5' 7.5" (1.715 m)  Wt 165 lb (74.844 kg)  BMI 25.45 kg/m2  LMP 02/18/2015 Height: 5' 7.5" (171.5 cm) Ht Readings from Last 3 Encounters:  03/03/15 5' 7.5" (1.715 m)  08/15/14 5' 7.25" (1.708 m)  08/03/14 5' 7.25" (1.708 m)    General appearance: alert, cooperative and appears stated age Head: Normocephalic, without obvious abnormality, atraumatic Neck: no adenopathy, supple, symmetrical, trachea midline and thyroid normal to inspection and palpation Lungs: clear to auscultation bilaterally Breasts: normal appearance, no masses or tenderness, No nipple retraction or dimpling, No nipple discharge or bleeding, No axillary or supraclavicular adenopathy Heart: regular rate and rhythm Abdomen: soft, non-tender; no masses,  no organomegaly Extremities: extremities normal, atraumatic, no cyanosis or edema Skin: Skin color, texture, turgor normal. No rashes or lesions Lymph nodes: Cervical, supraclavicular, and axillary nodes normal. No abnormal inguinal nodes palpated Neurologic: Grossly normal   Pelvic: External genitalia:  no lesions              Urethra:  normal appearing urethra with no masses, tenderness or lesions              Bartholin's and Skene's: normal                 Vagina: normal appearing vagina with normal color and discharge, no lesions              Cervix: normal appearance, no lesions or tenderness              Pap taken: Yes.   Bimanual Exam:  Uterus:  normal size, contour, position, consistency, mobility, non-tender              Adnexa: normal adnexa and no mass, fullness, tenderness                Rectovaginal: Confirms               Anus:  normal sphincter tone, no lesions or hemorrhoids noted, no blood in canal  Chaperone present: yes  A:  Well Woman with normal exam  Contraception  Abstinence  History of abnormal pap CIN 2 and ASCUS with HPVHR follow up second yearly pap today. Previous pap negative  Stool change with constipation no stool softener use, working on diet change   P:   Reviewed health and wellness pertinent to exam  Will advise if contraception needed  If pap normal will repeat per protocol if not per results  Discussed yogurt daily, has tried probiotic with no change. Will start on  Stool softener and increase water. Will advise if no change will need GI referral. Warning signs of blood in stool noted.  Lab Hep. C  Pap smear as above with HPV reflex   counseled on breast self exam, mammography screening, STD prevention, HIV risk factors and prevention, adequate intake of calcium and vitamin D, diet and exercise, Kegel's exercises  return annually or prn  An After Visit Summary was printed and given to the patient.

## 2015-03-03 NOTE — Patient Instructions (Signed)

## 2015-03-04 LAB — HEPATITIS C ANTIBODY: HCV AB: NEGATIVE

## 2015-03-04 NOTE — Progress Notes (Signed)
Reviewed personally.  M. Suzanne Willowdean Luhmann, MD.  

## 2015-03-14 ENCOUNTER — Telehealth: Payer: Self-pay

## 2015-03-14 NOTE — Telephone Encounter (Signed)
Called patient & left message giving normal pap results. DPR signed. 08 recall in per provider. See scanned in results.

## 2015-04-29 ENCOUNTER — Other Ambulatory Visit: Payer: Self-pay | Admitting: Nurse Practitioner

## 2015-05-01 NOTE — Telephone Encounter (Signed)
Medication refill request: Valtrex Last AEX:  03-03-15 Next AEX: 03-12-16 Last MMG (if hormonal medication request): 08-12-14 WNL Refill authorized: please advise   Sending to [facility] since DL is out of the office today

## 2015-07-21 ENCOUNTER — Other Ambulatory Visit: Payer: Self-pay | Admitting: Sports Medicine

## 2015-07-21 DIAGNOSIS — M25552 Pain in left hip: Secondary | ICD-10-CM

## 2015-08-03 ENCOUNTER — Ambulatory Visit
Admission: RE | Admit: 2015-08-03 | Discharge: 2015-08-03 | Disposition: A | Discharge status: Home / Self Care | Payer: 59 | Source: Ambulatory Visit | Attending: Sports Medicine | Admitting: Sports Medicine

## 2015-08-03 DIAGNOSIS — M25552 Pain in left hip: Secondary | ICD-10-CM

## 2015-08-03 MED ORDER — IOPAMIDOL (ISOVUE-M 200) INJECTION 41%
15.0000 mL | Freq: Once | INTRAMUSCULAR | Status: AC
  Administered 2015-08-03: 15 mL via INTRA_ARTICULAR

## 2015-08-07 ENCOUNTER — Encounter: Payer: Self-pay | Admitting: Sports Medicine

## 2015-08-07 ENCOUNTER — Ambulatory Visit (INDEPENDENT_AMBULATORY_CARE_PROVIDER_SITE_OTHER): Payer: 59 | Admitting: Sports Medicine

## 2015-08-07 VITALS — BP 113/70 | HR 72 | Ht 67.5 in | Wt 160.0 lb

## 2015-08-07 DIAGNOSIS — M169 Osteoarthritis of hip, unspecified: Secondary | ICD-10-CM

## 2015-08-07 DIAGNOSIS — M24159 Other articular cartilage disorders, unspecified hip: Secondary | ICD-10-CM

## 2015-08-07 NOTE — Progress Notes (Signed)
   Subjective:    Patient ID: Michelle Ewing, female    DOB: 01/19/1976, 40 y.o.   MRN: [id number]  HPI chief complaint left hip pain  Very pleasant 40 year old female comes in today complaining of 2 years of diffuse left hip pain. She does not recall any specific injury but rather describes a gradual onset of pain that has become much worse over the past 6-8 months. She describes an intermittent stabbing and sharp pain that will occur with activity sporadically. The pain is quite severe and will radiate down the entire left leg. Although she was initially pain-free at rest, she has started to have pretty severe pain at night. She recently [facility] who suspected a labral tear and ordered an MRI arthrogram. That study is available for review. She has a follow-up appointment with Dr. Penni BombardKendall later this week but wanted to see me for a second opinion. She has been treated with tramadol and ibuprofen. She had an adverse reaction to the tramadol so she stopped taking it. She has not had any type of cortisone injection into her left hip. No prior left hip surgeries.  Past medical history reviewed Medications reviewed Allergies reviewed    Review of Systems    as above Objective:   Physical Exam  Well-developed, well-nourished. No acute distress. Awake alert and oriented 3. Vital signs reviewed.  Left hip: Patient has reproducible pain with internal rotation with a positive FADIR. No pain with external rotation. No tenderness to palpation over the greater trochanteric bursa. Neurovascularly intact distally.  MRI arthrogram of the left hip is reviewed. It is dated 08/03/2015. There is evidence of full-thickness cartilage loss of the superior left femoral head and superior anterior acetabulum along with a rather severe degenerative tear of the anterior left labrum.      Assessment & Plan:   Chronic left hip pain secondary to a degenerative labral tear/hip DJD  [name]. I think most of her symptoms are originating from the degenerative labral tear but it is quite possible that some, or possibly all, of her symptoms may be coming from the early OA that she unfortunately has. I explained her treatment options including cortisone injection, arthroscopic debridement, hip resurfacing, and hip replacement. I also explained to her that there is a chance that arthroscopic debridement may not result in complete symptom relief. She understands. I recommended that she first follow-up with Dr. Penni BombardKendall for his opinion and then let me know how she would like to proceed. I will wait to hear back from her. She will follow-up with me as needed.  Total time spent with the patient was 30 minutes with greater than 50% of the time spent in face-to-face consultation reviewing her MRI arthrogram and discussing her treatment options.

## 2015-09-28 DIAGNOSIS — M1612 Unilateral primary osteoarthritis, left hip: Secondary | ICD-10-CM | POA: Insufficient documentation

## 2015-10-18 DIAGNOSIS — G43909 Migraine, unspecified, not intractable, without status migrainosus: Secondary | ICD-10-CM | POA: Insufficient documentation

## 2015-10-31 DIAGNOSIS — Z96642 Presence of left artificial hip joint: Secondary | ICD-10-CM | POA: Insufficient documentation

## 2015-11-17 DIAGNOSIS — Z471 Aftercare following joint replacement surgery: Secondary | ICD-10-CM | POA: Insufficient documentation

## 2015-11-17 DIAGNOSIS — Z96642 Presence of left artificial hip joint: Secondary | ICD-10-CM | POA: Insufficient documentation

## 2015-11-17 HISTORY — DX: Aftercare following joint replacement surgery: Z47.1

## 2016-02-29 ENCOUNTER — Other Ambulatory Visit: Payer: Self-pay | Admitting: Obstetrics & Gynecology

## 2016-02-29 NOTE — Telephone Encounter (Signed)
Medication refill request: Valacyclovir Last AEX:  02/21/15 DL Next AEX: 1/6/101/9/18 DL Last MMG (if hormonal medication request): 08/12/14 BIRADS2, Density C, [facility] Refill authorized: 05/01/15 #60 2R. Please advise. Thank you.

## 2016-03-12 ENCOUNTER — Ambulatory Visit (INDEPENDENT_AMBULATORY_CARE_PROVIDER_SITE_OTHER): Payer: 59 | Admitting: Certified Nurse Midwife

## 2016-03-12 ENCOUNTER — Encounter: Payer: Self-pay | Admitting: Certified Nurse Midwife

## 2016-03-12 VITALS — BP 110/70 | HR 72 | Resp 16 | Ht 67.25 in | Wt 167.0 lb

## 2016-03-12 DIAGNOSIS — Z01419 Encounter for gynecological examination (general) (routine) without abnormal findings: Secondary | ICD-10-CM

## 2016-03-12 DIAGNOSIS — L308 Other specified dermatitis: Secondary | ICD-10-CM

## 2016-03-12 NOTE — Progress Notes (Signed)
41 y.o. [id number] Single  Caucasian Fe here for annual exam. Periods normal now. Had some irregular times with period when had hip surgery 10/31/15. All normal now. Feels so much better from hip surgery. Not sexually active. Sees Primary care for aex and labs, recent visit, all normal per patient. Continues to have small area of ? Eczema on right labia, clears with unstressed and then reoccurs. Daughter has similar issue on scalp. No change in the past few years. No HSV outbreaks in past year. No other health issues today.  Patient's last menstrual period was 02/22/2016 (exact date).          Sexually active: No.  The current method of family planning is abstinence.    Exercising: No.  exercise Smoker:  no  Health Maintenance: Pap:  02-21-15 neg HPV HR neg, hx of abnormal LSIL in 2014, MMG:  08-12-14 category c density birads 2:neg Colonoscopy: no BMD:   no TDaP:  2012 Shingles: no Pneumonia: no Hep C and HIV: HIV neg 2014, Hep c neg 2016 Labs: pcp Self breast exam: done occ   reports that she has quit smoking. She has never used smokeless tobacco. She reports that she drinks about 0.6 - 1.2 oz of alcohol per week . She reports that she does not use drugs.  Past Medical History:  Diagnosis Date  . ASCUS with positive high risk HPV   . Depression   . Exercise-induced asthma   . Migraine with aura   . Smoker     Past Surgical History:  Procedure Laterality Date  . 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 TOTAL HIP ARTHROPLASTY  left    Current Outpatient Prescriptions  Medication Sig Dispense Refill  . BIOTIN PO Take by mouth daily.     . folic acid (FOLVITE) 400 MCG tablet Take 400 mcg by mouth daily.    . Multiple Vitamins-Minerals (MULTIVITAMIN WITH MINERALS) tablet Take 1 tablet by mouth daily.    . Multiple Vitamins-Minerals (ZINC PO) Take by mouth daily.    . Probiotic Product (PROBIOTIC PO) Take by mouth daily.    . SUMAtriptan (IMITREX) 50 MG tablet   1  .  valACYclovir (VALTREX) 500 MG tablet TAKE 1 TABLET DAILY, INCREASE TO TWICE A DAY FOR 3 DAYS AT ONSET 60 tablet 0   No current facility-administered medications for this visit.     Family History  Problem Relation Age of Onset  . Migraines Maternal Grandmother   . Heart disease Maternal Grandmother   . Hodgkin's lymphoma Maternal Grandfather     ROS:  Pertinent items are noted in HPI.  Otherwise, a comprehensive ROS was negative.  Exam:   BP 110/70   Pulse 72   Resp 16   Ht 5' 7.25" (1.708 m)   Wt 167 lb (75.8 kg)   LMP 02/22/2016 (Exact Date)   BMI 25.96 kg/m  Height: 5' 7.25" (170.8 cm) Ht Readings from Last 3 Encounters:  03/12/16 5' 7.25" (1.708 m)  08/07/15 5' 7.5" (1.715 m)  03/03/15 5' 7.5" (1.715 m)    General appearance: alert, cooperative and appears stated age Head: Normocephalic, without obvious abnormality, atraumatic Neck: no adenopathy, supple, symmetrical, trachea midline and thyroid normal to inspection and palpation Lungs: clear to auscultation bilaterally Breasts: normal appearance, no masses or tenderness, No nipple retraction or dimpling, No nipple discharge or bleeding, No axillary or supraclavicular adenopathy Heart: regular rate and rhythm Abdomen: soft, non-tender; no masses,  no  organomegaly Extremities: extremities normal, atraumatic, no cyanosis or edema Skin: Skin color, texture, turgor normal. No rashes or lesions Lymph nodes: Cervical, supraclavicular, and axillary nodes normal. No abnormal inguinal nodes palpated Neurologic: Grossly normal   Pelvic: External genitalia:  no lesions              Urethra:  normal appearing urethra with no masses, tenderness or lesions              Bartholin's and Skene's: normal                 Vagina: normal appearing vagina with normal color and discharge, no lesions              Cervix: multiparous appearance, no cervical motion tenderness and no lesions              Pap taken: No. Bimanual Exam:   Uterus:  normal size, contour, position, consistency, mobility, non-tender              Adnexa: normal adnexa and no mass, fullness, tenderness               Rectovaginal: Confirms               Anus:  normal sphincter tone, no lesions  Chaperone present: yes  A:  Well Woman with normal exam  Contraception abstinence  Left hip replacement in 10/2015 recovering well  Right vulva ezema, no change  P:   Reviewed health and wellness pertinent to exam  Will advise if need changes for contraception  Continue follow up as indicated  Discussed Aveeno Eczema cream trial to see if this resolves, if notices change will see Dermatology or come in for evaluation.  Pap smear as above not taken   counseled on breast self exam, mammography screening, adequate intake of calcium and vitamin D, diet and exercise  return annually or prn  An After Visit Summary was printed and given to the patient.

## 2016-03-12 NOTE — Patient Instructions (Signed)
EXERCISE AND DIET:  We recommended that you start or continue a regular exercise program for good health. Regular exercise means any activity that makes your heart beat faster and makes you sweat.  We recommend exercising at least 30 minutes per day at least 3 days a week, preferably 4 or 5.  We also recommend a diet low in fat and sugar.  Inactivity, poor dietary choices and obesity can cause diabetes, heart attack, stroke, and kidney damage, among others.    ALCOHOL AND SMOKING:  Women should limit their alcohol intake to no more than 7 drinks/beers/glasses of wine (combined, not each!) per week. Moderation of alcohol intake to this level decreases your risk of breast cancer and liver damage. And of course, no recreational drugs are part of a healthy lifestyle.  And absolutely no smoking or even second hand smoke. Most people know smoking can cause heart and lung diseases, but did you know it also contributes to weakening of your bones? Aging of your skin?  Yellowing of your teeth and nails?  CALCIUM AND VITAMIN D:  Adequate intake of calcium and Vitamin D are recommended.  The recommendations for exact amounts of these supplements seem to change often, but generally speaking 600 mg of calcium (either carbonate or citrate) and 800 units of Vitamin D per day seems prudent. Certain women may benefit from higher intake of Vitamin D.  If you are among these women, your doctor will have told you during your visit.    PAP SMEARS:  Pap smears, to check for cervical cancer or precancers,  have traditionally been done yearly, although recent scientific advances have shown that most women can have pap smears less often.  However, every woman still should have a physical exam from her gynecologist every year. It will include a breast check, inspection of the vulva and vagina to check for abnormal growths or skin changes, a visual exam of the cervix, and then an exam to evaluate the size and shape of the uterus and  ovaries.  And after 43 years of age, a rectal exam is indicated to check for rectal cancers. We will also provide age appropriate advice regarding health maintenance, like when you should have certain vaccines, screening for sexually transmitted diseases, bone density testing, colonoscopy, mammograms, etc.   MAMMOGRAMS:  All women over 40 years old should have a yearly mammogram. Many facilities now offer a "3D" mammogram, which may cost around $50 extra out of pocket. If possible,  we recommend you accept the option to have the 3D mammogram performed.  It both reduces the number of women who will be called back for extra views which then turn out to be normal, and it is better than the routine mammogram at detecting truly abnormal areas.    COLONOSCOPY:  Colonoscopy to screen for colon cancer is recommended for all women at age 50.  We know, you hate the idea of the prep.  We agree, BUT, having colon cancer and not knowing it is worse!!  Colon cancer so often starts as a polyp that can be seen and removed at colonscopy, which can quite literally save your life!  And if your first colonoscopy is normal and you have no family history of colon cancer, most women don't have to have it again for 10 years.  Once every ten years, you can do something that may end up saving your life, right?  We will be happy to help you get it scheduled when you are ready.    Be sure to check your insurance coverage so you understand how much it will cost.  It may be covered as a preventative service at no cost, but you should check your particular policy.      Atopic Dermatitis Atopic dermatitis is a skin disorder that causes inflammation of the skin. This is the most common type of eczema. Eczema is a group of skin conditions that cause the skin to be itchy, red, and swollen. This condition is generally worse during the cooler winter months and often improves during the warm summer months. Symptoms can vary from person to  person. Atopic dermatitis usually starts showing signs in infancy and can last through adulthood. This condition cannot be passed from one person to another (non-contagious), but is more common in families. Atopic dermatitis may not always be present. When it is present, it is called a flare-up. What are the causes? The exact cause of this condition is not known. Flare-ups of the condition may be triggered by:  Contact with something you are sensitive or allergic to.  Stress.  Certain foods.  Extremely hot or cold weather.  Harsh chemicals and soaps.  Dry air.  Chlorine. What increases the risk? This condition is more likely to develop in people who have a personal history or family history of eczema, allergies, asthma, or hay fever. What are the signs or symptoms? Symptoms of this condition include:  Dry, scaly skin.  Red, itchy rash.  Itchiness, which can be severe. This may occur before the skin rash. This can make sleeping difficult.  Skin thickening and cracking can occur over time. How is this diagnosed? This condition is diagnosed based on your symptoms, a medical history, and a physical exam. How is this treated? There is no cure for this condition, but symptoms can usually be controlled. Treatment focuses on:  Controlling the itching and scratching. You may be given medicines, such as antihistamines or steroid creams.  Limiting exposure to things that you are sensitive or allergic to (allergens).  Recognizing situations that cause stress and developing a plan to manage stress. If your atopic dermatitis does not get better with medicines or is all over your body (widespread) , a treatment using a specific type of light (phototherapy) may be used. Follow these instructions at home: Skin care  Keep your skin well-moisturized. This seals in moisture and help prevent dryness.  Use unscented lotions that have petroleum in them.  Avoid lotions that contain alcohol  and water. They can dry the skin.  Keep baths or showers short (less than 5 minutes) in warm water. Do not use hot water.  Use mild, unscented cleansers for bathing. Avoid soap and bubble bath.  Apply a moisturizer to your skin right after a bath or shower.   Do not apply anything to your skin without checking with your health care provider. General instructions  Dress in clothes made of cotton or cotton blends. Dress lightly because heat increases itching.  When washing your clothes, rinse your clothes twice so all of the soap is removed.  Avoid any triggers that can cause a flare-up.  Try to manage your stress.  Keep your fingernails cut short.  Avoid scratching. Scratching makes the rash and itching worse. It may also result in a skin infection (impetigo) due to a break in the skin caused by scratching.  Take or apply over-the-counter and prescription medicines only as told by your health care provider.  Keep all follow-up visits as told by your health  care provider. This is important.  Do not be around people who have cold sores or fever blisters. If you get the infection, it may cause your atopic dermatitis to worsen. Contact a health care provider if:  Your itching interferes with sleep.  Your rash gets worse or is not better within one week of starting treatment.  You have a fever.  You have a rash flare-up after having contact with someone who has cold sores or fever blisters. Get help right away if:  You develop pus or soft yellow scabs in the rash area. Summary  This condition causes a red rash and itchy, dry, scaly skin.  Treatment focuses on controlling the itching and scratching, limiting exposure to things that you are sensitive or allergic to (allergens), and recognizing situations that cause stress and developing a plan to manage stress.  Keep your skin well-moisturized.  Keep baths or showers less than 5 minutes. This information is not intended to  replace advice given to you by your health care provider. Make sure you discuss any questions you have with your health care provider. Document Released: 02/16/2000 Document Revised: 07/27/2015 Document Reviewed: 09/21/2012 Elsevier Interactive Patient Education  2017 [employer]

## 2016-03-16 NOTE — Progress Notes (Signed)
Encounter reviewed Ceriah Kohler, MD   

## 2016-05-23 DIAGNOSIS — Z719 Counseling, unspecified: Secondary | ICD-10-CM | POA: Diagnosis not present

## 2016-06-06 ENCOUNTER — Encounter: Payer: Self-pay | Admitting: Certified Nurse Midwife

## 2016-06-06 ENCOUNTER — Ambulatory Visit (INDEPENDENT_AMBULATORY_CARE_PROVIDER_SITE_OTHER): Payer: 59 | Admitting: Certified Nurse Midwife

## 2016-06-06 ENCOUNTER — Telehealth: Payer: Self-pay | Admitting: Certified Nurse Midwife

## 2016-06-06 VITALS — BP 104/64 | HR 64 | Resp 16 | Ht 67.25 in | Wt 171.0 lb

## 2016-06-06 DIAGNOSIS — N898 Other specified noninflammatory disorders of vagina: Secondary | ICD-10-CM | POA: Diagnosis not present

## 2016-06-06 DIAGNOSIS — N762 Acute vulvitis: Secondary | ICD-10-CM | POA: Diagnosis not present

## 2016-06-06 DIAGNOSIS — T7840XA Allergy, unspecified, initial encounter: Secondary | ICD-10-CM | POA: Diagnosis not present

## 2016-06-06 MED ORDER — TRIAMCINOLONE ACETONIDE 0.025 % EX OINT: 1.0000 "application " | TOPICAL_OINTMENT | Freq: Two times a day (BID) | CUTANEOUS | 1 refills | Status: DC

## 2016-06-06 NOTE — Progress Notes (Signed)
41 y.o. Single Caucasian female [phone] here with complaint of vaginal symptoms of burning after nicking self with razor and treated with Bacitracin on  shaved vulva area and started burning. Washed after burning with slight relief,and slight increase discharge. Describes discharge as watery no odor. Onset of symptoms last pm. Denies new personal products or vaginal dryness. No STD concerns. Urinary symptoms none . Contraception is abstinence.  ROS pertinent to HPI  O:Healthy female WDWN Affect: normal, orientation x 3  Exam:Skin: warm and dry Abdomen: Soft, no masses  inguinal Lymph nodes: no enlargement or tenderness Pelvic exam: External genital: normal female, with small abrasion on top of left vulva at 12 o'clock. Diffuse redness noted on vulva bilateral. Continues to have small area of ? Psoriasis on right vulva at 12 o'clock, no change. BUS: negative Vagina: wartery discharge noted. Ph:4.0   ,Wet prep taken, Cervix: normal, non tender, no CMT Uterus: normal, non tender Adnexa:normal, non tender, no masses or fullness noted   Wet Prep results: KOH,Saline negative for pathogens   A:Normal pelvic exam Localized reaction to topical antibacterial cream suspected with razor burn on vulva area Psoriasis on vulva chronic, responds to steroid use    P:Discussed findings of localized reaction  and etiology. Discussed Aveeno  sitz bath for comfort bid, prn. Avoid tight clothing and not other creams or lotions. Try clipping instead of shaving in vulva area. Rx Triamcinolone cream bid x 5 days to affected area. See order  Notify if no change or condition worsens. Patient agreeable.  Rv prn

## 2016-06-06 NOTE — Telephone Encounter (Signed)
Spoke with patient. Patient states she had shaved vaginal area on 06/05/16 with what she thought was a fresh razor and noticed a cut on vulva. Patient states she applied bacitracin ointment after. Patient states she had to apply ice to area throughout night for redness and discomfort. Patient reports area is swollen at cut and some drainage noted. Patient states she stayed out of work today d/t discomfort. Patient reports history of vulva eczema and is curious if any effect. Recommended OV for further evaluation, patient scheduled for today at 10:15am with Leota Sauers, CNM. Patient is agreeable to date and time.   Routing to provider for final review. Patient is agreeable to disposition. Will close encounter.

## 2016-06-06 NOTE — Telephone Encounter (Signed)
Patient shaved her vaginal area and cut herself. She states she put some cream on it but now thinks she may be having an allergic reaction.

## 2016-06-11 NOTE — Progress Notes (Signed)
Encounter reviewed Michelle Springsteen, MD   

## 2016-08-06 DIAGNOSIS — R5383 Other fatigue: Secondary | ICD-10-CM | POA: Diagnosis not present

## 2016-11-05 DIAGNOSIS — Z09 Encounter for follow-up examination after completed treatment for conditions other than malignant neoplasm: Secondary | ICD-10-CM | POA: Insufficient documentation

## 2016-11-06 DIAGNOSIS — Z96642 Presence of left artificial hip joint: Secondary | ICD-10-CM | POA: Diagnosis not present

## 2016-11-06 DIAGNOSIS — Z09 Encounter for follow-up examination after completed treatment for conditions other than malignant neoplasm: Secondary | ICD-10-CM | POA: Diagnosis not present

## 2016-11-19 ENCOUNTER — Ambulatory Visit (INDEPENDENT_AMBULATORY_CARE_PROVIDER_SITE_OTHER): Payer: 59 | Admitting: Certified Nurse Midwife

## 2016-11-19 ENCOUNTER — Encounter: Payer: Self-pay | Admitting: Certified Nurse Midwife

## 2016-11-19 VITALS — BP 116/78 | HR 72 | Resp 16 | Ht 67.25 in | Wt 170.0 lb

## 2016-11-19 DIAGNOSIS — N943 Premenstrual tension syndrome: Secondary | ICD-10-CM | POA: Diagnosis not present

## 2016-11-19 DIAGNOSIS — F418 Other specified anxiety disorders: Secondary | ICD-10-CM | POA: Diagnosis not present

## 2016-11-19 DIAGNOSIS — G4700 Insomnia, unspecified: Secondary | ICD-10-CM | POA: Diagnosis not present

## 2016-11-19 MED ORDER — ESCITALOPRAM OXALATE 10 MG PO TABS: 10.0000 mg | ORAL_TABLET | Freq: Every day | ORAL | 0 refills | Status: DC
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
## 2016-11-19 NOTE — Patient Instructions (Signed)

## 2016-12-06 ENCOUNTER — Ambulatory Visit (INDEPENDENT_AMBULATORY_CARE_PROVIDER_SITE_OTHER): Payer: 59 | Admitting: Certified Nurse Midwife

## 2016-12-06 ENCOUNTER — Encounter: Payer: Self-pay | Admitting: Certified Nurse Midwife

## 2016-12-06 VITALS — BP 114/70 | HR 84 | Resp 16 | Wt 166.0 lb

## 2016-12-06 DIAGNOSIS — F418 Other specified anxiety disorders: Secondary | ICD-10-CM

## 2016-12-06 MED ORDER — ESCITALOPRAM OXALATE 10 MG PO TABS: 10.0000 mg | ORAL_TABLET | Freq: Every day | ORAL | 2 refills | Status: DC

## 2016-12-06 NOTE — Progress Notes (Signed)
41 y.o.Divorced Caucasian female [phone] here for follow-up of anxiety/depression being treated with  Lexapro 10 mg   Initiated 11/19/16.  Patient taking medication as instructed at bedtime..  Denies nausea, headache or other medication side effects.  Reports crying  Sporadic has stopped and has not had another panic attack. Insomnia is getting better with taking Lexapro at night. Still some  Fatigue, but doing things with daughter and actually turned down friends who drink to be with her. She feels so much more in control and actually was told at work " she was looking great". No  thoughts of self harm or others. She expressed she was reluctant to start medication, but could not go on the way she felt. Has not felt she needs counseling at this point. No other concerns at this time.  O:Healthy WD,WN female, appropriately dressed    Weight:166, down 4 pounds Affect : Appropriate  Well dressed, WNWD  A:1-Anxiety responding to Lexapro,no contraindications to use at this point 2-Counseling will start if felt needed.  P:1- Continue medication as prescribed 2-RX Lexapro updated 3-RV 6 weeks unless feels things are changing or medication problems 4-Instructed if thoughts of self harm or others seek immediate help 911 or emergency room.  Questions addressed at length.   Face to face time regarding medication use and current status 25 minutes

## 2017-01-17 ENCOUNTER — Other Ambulatory Visit: Payer: Self-pay

## 2017-01-17 ENCOUNTER — Ambulatory Visit (INDEPENDENT_AMBULATORY_CARE_PROVIDER_SITE_OTHER): Payer: 59 | Admitting: Certified Nurse Midwife

## 2017-01-17 ENCOUNTER — Encounter: Payer: Self-pay | Admitting: Certified Nurse Midwife

## 2017-01-17 VITALS — BP 104/72 | HR 74 | Resp 14 | Wt 167.2 lb

## 2017-01-17 DIAGNOSIS — F418 Other specified anxiety disorders: Secondary | ICD-10-CM | POA: Diagnosis not present

## 2017-01-17 MED ORDER — ESCITALOPRAM OXALATE 10 MG PO TABS: 10.0000 mg | ORAL_TABLET | Freq: Every day | ORAL | 2 refills | Status: DC

## 2017-01-17 NOTE — Progress Notes (Signed)
41 y.o.Divorced Caucasian female [phone] here for follow-up of anxiety/depression being treated with  Lexapro 10 mg daily at hs. Initiated 11/19/16. Patient taking medication as instructed .  Denies nausea, headache or other medication side effects. She is not worrying about house being cluttered or clothes not perfect now. Reports no crying unless appropriate. Denies any panic attacks. No insomnia, sleeping much better. If wakes up does notes for work and then can sleep.  Has had fatigue, but work is so busy which is normal for this time of year.  No thoughts of self harm or others.  Feelings of anger are getting less now." I feel like I don't have to justify how I feel, I feel better". Daughter has noted "mom sounds like she used to ". Would like to continue Lexapro, feels she is having no issues with use and aware the holidays may be hard.Marland Kitchen.  O:Healthy WD,WN female, appropriately dressed    Weight:167 Affect : Appropriate    A:1-Anxiety Depression responding well  to Lexapro No contraindication for use Anxiety/depression stable at present with decrease in symptoms 2-Counseling available but has not used  P:1- Continue medication as prescribed 2-RX Lexapro 10 mg see order with instructions 3-RV after holidays about 8-10 weeks unless needed prior 4-Instructed if thoughts of self harm or others seek immediate help 911 or emergency room.  Questions addressed.            35 minutes in consultation regarding anxiety and depression and medication response

## 2017-01-17 NOTE — Patient Instructions (Signed)
Call if problems during holidays

## 2017-01-23 ENCOUNTER — Other Ambulatory Visit: Payer: Self-pay | Admitting: Certified Nurse Midwife

## 2017-01-27 NOTE — Telephone Encounter (Signed)
Medication refill request: Valtrex Last AEX:  03/12/16 DL Next AEX: [id number]  Last MMG (if hormonal medication request):  08-12-14 category c density birads 2:neg Refill authorized: 03/06/16 #60 w/0 refills; today please advise, DL out of office

## 2017-03-13 ENCOUNTER — Other Ambulatory Visit: Payer: Self-pay

## 2017-03-13 ENCOUNTER — Ambulatory Visit (INDEPENDENT_AMBULATORY_CARE_PROVIDER_SITE_OTHER): Payer: 59 | Admitting: Certified Nurse Midwife

## 2017-03-13 ENCOUNTER — Encounter: Payer: Self-pay | Admitting: Certified Nurse Midwife

## 2017-03-13 ENCOUNTER — Other Ambulatory Visit (HOSPITAL_COMMUNITY)
Admission: RE | Admit: 2017-03-13 | Discharge: 2017-03-13 | Disposition: A | Discharge status: Home / Self Care | Payer: 59 | Source: Ambulatory Visit | Attending: Certified Nurse Midwife | Admitting: Certified Nurse Midwife

## 2017-03-13 VITALS — BP 102/54 | HR 72 | Resp 14 | Ht 67.0 in | Wt 172.0 lb

## 2017-03-13 DIAGNOSIS — B009 Herpesviral infection, unspecified: Secondary | ICD-10-CM

## 2017-03-13 DIAGNOSIS — F418 Other specified anxiety disorders: Secondary | ICD-10-CM

## 2017-03-13 DIAGNOSIS — Z79899 Other long term (current) drug therapy: Secondary | ICD-10-CM | POA: Insufficient documentation

## 2017-03-13 DIAGNOSIS — Z1151 Encounter for screening for human papillomavirus (HPV): Secondary | ICD-10-CM | POA: Diagnosis not present

## 2017-03-13 DIAGNOSIS — Z124 Encounter for screening for malignant neoplasm of cervix: Secondary | ICD-10-CM

## 2017-03-13 DIAGNOSIS — Z0000 Encounter for general adult medical examination without abnormal findings: Secondary | ICD-10-CM

## 2017-03-13 DIAGNOSIS — Z01419 Encounter for gynecological examination (general) (routine) without abnormal findings: Secondary | ICD-10-CM

## 2017-03-13 MED ORDER — ESCITALOPRAM OXALATE 10 MG PO TABS: 10.0000 mg | ORAL_TABLET | Freq: Every day | ORAL | 2 refills | Status: DC

## 2017-03-13 MED ORDER — VALACYCLOVIR HCL 500 MG PO TABS: ORAL_TABLET | ORAL | 12 refills | Status: DC

## 2017-03-13 NOTE — Progress Notes (Signed)
42 y.o. [id number] Single  Caucasian Fe here for annual exam. Periods normal, no issues. Lexapro working well for anxiety and depression. No migraines in the past 4 months and no Imitrex. No HSV outbreaks. Needs update on Rx. Patient made it through holidays with no crying or emotional issues. All family in with no concerns. Sees Dr. Abigail Miyamoto PCP, prn and for Imitrex management. Screening labs today. Not sexually active, no screening needed. No other health issues today.  Patient's last menstrual period was 03/10/2017.          Sexually active: No  The current method of family planning is none.    Exercising: Yes.    hiking Smoker:  Former smoker   Health Maintenance: Pap:  02-21-15 neg HPV HR neg History of Abnormal Pap: yes MMG:  08-12-14 category c density birads 2:neg Self Breast exams: yes Colonoscopy:  no BMD:   no TDaP:  2012 Shingles: no Pneumonia: no Hep C and HIV: Hep c neg 2016 Labs: discuss with provider   reports that she has quit smoking. she has never used smokeless tobacco. She reports that she drinks about 0.6 - 1.2 oz of alcohol per week. She reports that she does not use drugs.  Past Medical History:  Diagnosis Date  . ASCUS with positive high risk HPV   . Depression   . Exercise-induced asthma   . Migraine with aura   . Smoker     Past Surgical History:  Procedure Laterality Date  . 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 TOTAL HIP ARTHROPLASTY  left    Current Outpatient Medications  Medication Sig Dispense Refill  . BIOTIN PO Take by mouth daily.     Marland Kitchen escitalopram (LEXAPRO) 10 MG tablet Take 1 tablet (10 mg total) daily by mouth. 30 tablet 2  . folic acid (FOLVITE) 400 MCG tablet Take 400 mcg by mouth daily.    . Multiple Vitamins-Minerals (MULTIVITAMIN WITH MINERALS) tablet Take 1 tablet by mouth daily.    . promethazine (PHENERGAN) 25 MG tablet TAKE 1 TABLET BY MOUTH EVERY 12 HOURS AS NEEDED FOR 5 DAYS  5  . SUMAtriptan (IMITREX) 50 MG  tablet as needed.   1  . triamcinolone (KENALOG) 0.025 % ointment Apply 1 application topically 2 (two) times daily. 30 g 1  . valACYclovir (VALTREX) 500 MG tablet TAKE 1 TABLET DAILY, INCREASE TO TWICE A DAY FOR 3 DAYS AT ONSET 30 tablet 0   No current facility-administered medications for this visit.     Family History  Problem Relation Age of Onset  . Migraines Maternal Grandmother   . Heart disease Maternal Grandmother   . Hodgkin's lymphoma Maternal Grandfather     ROS:  Pertinent items are noted in HPI.  Otherwise, a comprehensive ROS was negative.  Exam:   BP (!) 102/54 (BP Location: Right Arm, Patient Position: Sitting, Cuff Size: Normal)   Pulse 72   Resp 14   Ht 5\' 7"  (1.702 m)   Wt 172 lb (78 kg)   LMP 03/10/2017   BMI 26.94 kg/m  Height: 5\' 7"  (170.2 cm) Ht Readings from Last 3 Encounters:  03/13/17 5\' 7"  (1.702 m)  11/19/16 5' 7.25" (1.708 m)  06/06/16 5' 7.25" (1.708 m)    General appearance: alert, cooperative and appears stated age Head: Normocephalic, without obvious abnormality, atraumatic Neck: no adenopathy, supple, symmetrical, trachea midline and thyroid normal to inspection and palpation Lungs: clear to auscultation bilaterally Breasts: normal  appearance, no masses or tenderness, No nipple retraction or dimpling, No nipple discharge or bleeding, No axillary or supraclavicular adenopathy Heart: regular rate and rhythm Abdomen: soft, non-tender; no masses,  no organomegaly Extremities: extremities normal, atraumatic, no cyanosis or edema Skin: Skin color, texture, turgor normal. No rashes or lesions Lymph nodes: Cervical, supraclavicular, and axillary nodes normal. No abnormal inguinal nodes palpated Neurologic: Grossly normal   Pelvic: External genitalia:  no lesions              Urethra:  normal appearing urethra with no masses, tenderness or lesions              Bartholin's and Skene's: normal                 Vagina: normal appearing vagina with  normal color and discharge, no lesions              Cervix: multiparous appearance, no cervical motion tenderness and no lesions              Pap taken: Yes.   Bimanual Exam:  Uterus:  normal size, contour, position, consistency, mobility, non-tender              Adnexa: normal adnexa and no mass, fullness, tenderness               Rectovaginal: Confirms               Anus:  normal sphincter tone, no lesions  Chaperone present: yes  A:  Well Woman with normal exam  Contraception condoms if sexually active  Depression Lexapro working well, desires continuance  History of HSV, needs Rx update  Migraine headache history no aura PCP manages  Screening labs  P:   Reviewed health and wellness pertinent to exam  Warning signs reviewed with Lexapro use and need to advise.  Rx Lexapro see order with instructions  Rx Valtrex see order with instructions  Continue follow up with MD as indicated  Pap smear: yes  Labs: CMP,Lipid panel, TSH, Vitamin D   counseled on breast self exam, mammography screening, STD prevention, HIV risk factors and prevention, adequate intake of calcium and vitamin D, diet and exercise  return annually or prn  An After Visit Summary was printed and given to the patient.

## 2017-03-13 NOTE — Patient Instructions (Signed)

## 2017-03-14 ENCOUNTER — Ambulatory Visit: Payer: 59 | Admitting: Certified Nurse Midwife

## 2017-03-14 LAB — TSH: TSH: 0.82 u[IU]/mL (ref 0.450–4.500)

## 2017-03-14 LAB — COMPREHENSIVE METABOLIC PANEL
A/G RATIO: 1.5 (ref 1.2–2.2)
ALT: 9 IU/L (ref 0–32)
AST: 16 IU/L (ref 0–40)
Albumin: 4.5 g/dL (ref 3.5–5.5)
Alkaline Phosphatase: 62 IU/L (ref 39–117)
BUN/Creatinine Ratio: 16 (ref 9–23)
BUN: 15 mg/dL (ref 6–24)
CO2: 25 mmol/L (ref 20–29)
Calcium: 9.3 mg/dL (ref 8.7–10.2)
Chloride: 99 mmol/L (ref 96–106)
Creatinine, Ser: 0.94 mg/dL (ref 0.57–1.00)
GFR calc Af Amer: 86 mL/min/{1.73_m2} (ref >59)
GFR calc non Af Amer: 75 mL/min/{1.73_m2} (ref >59)
Globulin, Total: 3.1 g/dL (ref 1.5–4.5)
Glucose: 88 mg/dL (ref 65–99)
POTASSIUM: 4.4 mmol/L (ref 3.5–5.2)
Sodium: 138 mmol/L (ref 134–144)
Total Protein: 7.6 g/dL (ref 6.0–8.5)

## 2017-03-14 LAB — VITAMIN D 25 HYDROXY (VIT D DEFICIENCY, FRACTURES): VIT D 25 HYDROXY: 26.3 ng/mL — AB (ref 30.0–100.0)

## 2017-03-14 LAB — LIPID PANEL
CHOLESTEROL TOTAL: 198 mg/dL (ref 100–199)
Chol/HDL Ratio: 2.4 ratio (ref 0.0–4.4)
HDL: 84 mg/dL (ref >39)
LDL Calculated: 93 mg/dL (ref 0–99)
Triglycerides: 106 mg/dL (ref 0–149)
VLDL Cholesterol Cal: 21 mg/dL (ref 5–40)

## 2017-03-17 LAB — CYTOLOGY - PAP
Diagnosis: NEGATIVE
HPV: NOT DETECTED

## 2017-05-03 DIAGNOSIS — S93401A Sprain of unspecified ligament of right ankle, initial encounter: Secondary | ICD-10-CM | POA: Diagnosis not present

## 2017-09-16 ENCOUNTER — Other Ambulatory Visit: Payer: Self-pay | Admitting: Certified Nurse Midwife

## 2017-09-16 DIAGNOSIS — F418 Other specified anxiety disorders: Secondary | ICD-10-CM

## 2017-09-16 NOTE — Telephone Encounter (Signed)
Medication refill request: lexapro Last AEX:  03/13/17 DL Next AEX: 1/61/091/23/20 DL Last MMG (if hormonal medication request): 08/12/14 BIRADS2:benign  Refill authorized: 03/13/17 #30/2R. Today please advise.

## 2017-11-11 ENCOUNTER — Telehealth: Payer: Self-pay | Admitting: Certified Nurse Midwife

## 2017-11-11 NOTE — Telephone Encounter (Signed)
Spoke with patient. Patient reports she has been becoming increasingly frustrated with urinary incontinence. Now wears pantyl liner daily. Experiences stress incontinence, nocturia, pressure on bladder when sitting. Voids small amounts despite increased fluid intake. Weight gain in last month. Cycles have become more irregular and experiences increased cramping.   Denies lower back pain, blood in urine, fever/chills.   Patient will be traveling 9/12 & 9/13, is aware Michelle Ewing, CNM is out of the office on 9/11, patient request first available provider. OV scheduled for 9/11 at 3pm with Dr. Edward Ewing.   Routing to provider for final review. Patient is agreeable to disposition. Will close encounter.  Cc: Dr. Edward Ewing

## 2017-11-11 NOTE — Telephone Encounter (Signed)
Left message to call Landree Fernholz at [phone].  

## 2017-11-11 NOTE — Telephone Encounter (Signed)
Patient thinks she may have a uti and declined appointment today. Patient is available tomorrow, however Debbi is out of the office tomorrow. Patient also has some additional concerns to discuss.

## 2017-11-11 NOTE — Telephone Encounter (Signed)
Patient is returning a call to Jill. °

## 2017-11-12 ENCOUNTER — Encounter: Payer: Self-pay | Admitting: Obstetrics and Gynecology

## 2017-11-12 ENCOUNTER — Ambulatory Visit (INDEPENDENT_AMBULATORY_CARE_PROVIDER_SITE_OTHER): Payer: 59 | Admitting: Obstetrics and Gynecology

## 2017-11-12 ENCOUNTER — Other Ambulatory Visit: Payer: Self-pay

## 2017-11-12 VITALS — BP 118/80 | HR 78 | Resp 12 | Ht 67.75 in | Wt 182.1 lb

## 2017-11-12 DIAGNOSIS — R4586 Emotional lability: Secondary | ICD-10-CM

## 2017-11-12 DIAGNOSIS — R32 Unspecified urinary incontinence: Secondary | ICD-10-CM

## 2017-11-12 DIAGNOSIS — K5900 Constipation, unspecified: Secondary | ICD-10-CM | POA: Diagnosis not present

## 2017-11-12 DIAGNOSIS — R350 Frequency of micturition: Secondary | ICD-10-CM | POA: Diagnosis not present

## 2017-11-12 LAB — POCT URINALYSIS DIPSTICK
Bilirubin, UA: NEGATIVE
Glucose, UA: NEGATIVE
Ketones, UA: NEGATIVE
LEUKOCYTES UA: NEGATIVE
NITRITE UA: NEGATIVE
PH UA: 5 (ref 5.0–8.0)
PROTEIN UA: NEGATIVE
RBC UA: NEGATIVE
UROBILINOGEN UA: 0.2 U/dL

## 2017-11-12 MED ORDER — DULOXETINE HCL 30 MG PO CPEP: 30.0000 mg | ORAL_CAPSULE | Freq: Every day | ORAL | 5 refills | Status: DC

## 2017-11-12 NOTE — Progress Notes (Signed)
GYNECOLOGY  VISIT   HPI: 42 y.o.   Single  Caucasian  female   [phone] with Patient's last menstrual period was 10/31/2017.   here for increased urinary incontinence and frequency. Has a lot of concerns today.   Since starting Lexapro, is having a lot of changes.  Feeling much better.   Is tired all the time.   Took Wellbutrin in the past and had a bad reaction to it.  Heart racing.   Gained 20 pounds, constipation, increased incontinence.  Tried Weight Watcher.  Has some urgency and then not able to void well.  Changed her diet and increased water, and this is not helping.  Not wanting to use a laxative.  Urinary incontinence with exercise, sneeze, cough.  Night time urination.  Loss of urine spontaneously.   Not as active due to her hip replacement, but feels like she is running around a lot.   Single mother with 2 daughters.  Works in Systems developer.   Urine dip - neg.   GYNECOLOGIC HISTORY: Patient's last menstrual period was 10/31/2017. Contraception:  abstinence  Menopausal hormone therapy:  none Last mammogram:  08-12-14 category c density birads 2:neg Last pap smear:   03/13/17 Neg:Neg HR HPV        OB History    Gravida  5   Para  2   Term  2   Preterm      AB  3   Living  2     SAB  1   TAB  2   Ectopic      Multiple      Live Births  2              Patient Active Problem List   Diagnosis Date Noted  . Follow-up examination after orthopedic surgery 11/05/2016  . Aftercare following left hip joint replacement surgery 11/17/2015  . History of total left hip arthroplasty 11/17/2015  . Status post left hip replacement 10/31/2015  . Migraine 10/18/2015  . Primary osteoarthritis of left hip 09/28/2015    Past Medical History:  Diagnosis Date  . ASCUS with positive high risk HPV   . Depression   . Exercise-induced asthma   . Migraine with aura   . Smoker     Past Surgical History:  Procedure Laterality Date  . COLPOSCOPY   11/28/10   With hx CIN , 2014 LGSIL  . TOTAL HIP ARTHROPLASTY  left    Current Outpatient Medications  Medication Sig Dispense Refill  . BIOTIN PO Take by mouth daily.     Marland Kitchen escitalopram (LEXAPRO) 10 MG tablet TAKE 1 TABLET (10 MG TOTAL) DAILY 30 tablet 5  . folic acid (FOLVITE) 400 MCG tablet Take 400 mcg by mouth daily.    . Multiple Vitamins-Minerals (MULTIVITAMIN WITH MINERALS) tablet Take 1 tablet by mouth daily.    . promethazine (PHENERGAN) 25 MG tablet TAKE 1 TABLET BY MOUTH EVERY 12 HOURS AS NEEDED FOR 5 DAYS  5  . SUMAtriptan (IMITREX) 50 MG tablet as needed.   1  . triamcinolone (KENALOG) 0.025 % ointment Apply 1 application topically 2 (two) times daily. 30 g 1  . valACYclovir (VALTREX) 500 MG tablet Take one tablet bid at onset of outbreak for 3 days 30 tablet 12   No current facility-administered medications for this visit.      ALLERGIES: Peroxide [hydrogen peroxide]; Benzalkonium chloride; and Neosporin [neomycin-bacitracin zn-polymyx]  Family History  Problem Relation Age of Onset  . Migraines Maternal  Grandmother   . Heart disease Maternal Grandmother   . Hodgkin's lymphoma Maternal Grandfather     Social History   Socioeconomic History  . Marital status: Single    Spouse name: Not on file  . Number of children: Not on file  . Years of education: Not on file  . Highest education level: Not on file  Occupational History  . Not on file  Social Needs  . Financial resource strain: Not on file  . Food insecurity:    Worry: Not on file    Inability: Not on file  . Transportation needs:    Medical: Not on file    Non-medical: Not on file  Tobacco Use  . Smoking status: Former Games developer  . Smokeless tobacco: Never Used  Substance and Sexual Activity  . Alcohol use: Yes    Alcohol/week: 1.0 - 2.0 standard drinks    Types: 1 - 2 Standard drinks or equivalent per week  . Drug use: No  . Sexual activity: Not Currently    Partners: Male    Birth  control/protection: Condom  Lifestyle  . Physical activity:    Days per week: Not on file    Minutes per session: Not on file  . Stress: Not on file  Relationships  . Social connections:    Talks on phone: Not on file    Gets together: Not on file    Attends religious service: Not on file    Active member of club or organization: Not on file    Attends meetings of clubs or organizations: Not on file    Relationship status: Not on file  . Intimate partner violence:    Fear of current or ex partner: Not on file    Emotionally abused: Not on file    Physically abused: Not on file    Forced sexual activity: Not on file  Other Topics Concern  . Not on file  Social History Narrative  . Not on file    Review of Systems  Constitutional:       Weight gain    HENT: Negative.   Eyes: Negative.   Respiratory: Negative.   Gastrointestinal: Positive for abdominal distention and constipation.  Endocrine: Negative.   Genitourinary: Positive for frequency and urgency.       Loss of urine spontaneously Loss of urine with cough or sneeze Night urination    Musculoskeletal: Positive for arthralgias and myalgias.  Allergic/Immunologic: Negative.   Neurological: Negative.   Hematological: Negative.   Psychiatric/Behavioral: Negative.     PHYSICAL EXAMINATION:    BP 118/80 (BP Location: Right Arm, Patient Position: Sitting, Cuff Size: Normal)   Pulse 78   Resp 12   Ht 5' 7.75" (1.721 m)   Wt 182 lb 1.6 oz (82.6 kg)   LMP 10/31/2017   BMI 27.89 kg/m     General appearance: alert, cooperative and appears stated age   Pelvic: External genitalia:  no lesions              Urethra:  normal appearing urethra with no masses, tenderness or lesions              Bartholins and Skenes: normal                 Vagina: normal appearing vagina with normal color and discharge, no lesions              Cervix: no lesions  Bimanual Exam:  Uterus:  normal size, contour, position,  consistency, mobility, non-tender              Adnexa: no mass, fullness, tenderness              Chaperone was present for exam.  ASSESSMENT  Mood disorder.  On Lexapro.  Weight gain.  Constipation.  Mixed incontinence.  Urinary frequency.   PLAN  We discussed SSRIs and SNRIs in detail.  She will switch from Lexapro to Cymbalta.   We talked about stool softeners, probiotics, and increasing water.  Discussed incontinence.   Hopefully, the Cymbalta will help this.  Bladder irritants discussed. She declines physical therapy.  I did briefly discuss midurethral slings.  FU in 6 weeks.    An After Visit Summary was printed and given to the patient.  ___25___ minutes face to face time of which over 50% was spent in counseling.

## 2017-11-12 NOTE — Patient Instructions (Addendum)
Constipation, Adult Constipation is when a person has fewer bowel movements in a week than normal, has difficulty having a bowel movement, or has stools that are dry, hard, or larger than normal. Constipation may be caused by an underlying condition. It may become worse with age if a person takes certain medicines and does not take in enough fluids. Follow these instructions at home: Eating and drinking   Eat foods that have a lot of fiber, such as fresh fruits and vegetables, whole grains, and beans.  Limit foods that are high in fat, low in fiber, or overly processed, such as french fries, hamburgers, cookies, candies, and soda.  Drink enough fluid to keep your urine clear or pale yellow. General instructions  Exercise regularly or as told by your health care provider.  Go to the restroom when you have the urge to go. Do not hold it in.  Take over-the-counter and prescription medicines only as told by your health care provider. These include any fiber supplements.  Practice pelvic floor retraining exercises, such as deep breathing while relaxing the lower abdomen and pelvic floor relaxation during bowel movements.  Watch your condition for any changes.  Keep all follow-up visits as told by your health care provider. This is important. Contact a health care provider if:  You have pain that gets worse.  You have a fever.  You do not have a bowel movement after 4 days.  You vomit.  You are not hungry.  You lose weight.  You are bleeding from the anus.  You have thin, pencil-like stools. Get help right away if:  You have a fever and your symptoms suddenly get worse.  You leak stool or have blood in your stool.  Your abdomen is bloated.  You have severe pain in your abdomen.  You feel dizzy or you faint. This information is not intended to replace advice given to you by your health care provider. Make sure you discuss any questions you have with your health care  provider. Document Released: 11/17/2003 Document Revised: 09/08/2015 Document Reviewed: 08/09/2015 Elsevier Interactive Patient Education  2018 [employer].   Duloxetine delayed-release capsules What is this medicine? DULOXETINE (doo LOX e teen) is used to treat depression, anxiety, and different types of chronic pain. This medicine may be used for other purposes; ask your health care provider or pharmacist if you have questions. COMMON BRAND NAME(S): Cymbalta, Irenka What should I tell my health care provider before I take this medicine? They need to know if you have any of these conditions: -bipolar disorder or a family history of bipolar disorder -glaucoma -kidney disease -liver disease -suicidal thoughts or a previous suicide attempt -taken medicines called MAOIs like Carbex, Eldepryl, Marplan, Nardil, and Parnate within 14 days -an unusual reaction to duloxetine, other medicines, foods, dyes, or preservatives -pregnant or trying to get pregnant -breast-feeding How should I use this medicine? Take this medicine by mouth with a glass of water. Follow the directions on the prescription label. Do not cut, crush or chew this medicine. You can take this medicine with or without food. Take your medicine at regular intervals. Do not take your medicine more often than directed. Do not stop taking this medicine suddenly except upon the advice of your doctor. Stopping this medicine too quickly may cause serious side effects or your condition may worsen. A special MedGuide will be given to you by the pharmacist with each prescription and refill. Be sure to read this information carefully each time. Talk  to your pediatrician regarding the use of this medicine in children. While this drug may be prescribed for children as young as 8 years of age for selected conditions, precautions do apply. Overdosage: If you think you have taken too much of this medicine contact a poison control center or  emergency room at once. NOTE: This medicine is only for you. Do not share this medicine with others. What if I miss a dose? If you miss a dose, take it as soon as you can. If it is almost time for your next dose, take only that dose. Do not take double or extra doses. What may interact with this medicine? Do not take this medicine with any of the following medications: -desvenlafaxine -levomilnacipran -linezolid -MAOIs like Carbex, Eldepryl, Marplan, Nardil, and Parnate -methylene blue (injected into a vein) -milnacipran -thioridazine -venlafaxine This medicine may also interact with the following medications: -alcohol -amphetamines -aspirin and aspirin-like medicines -certain antibiotics like ciprofloxacin and enoxacin -certain medicines for blood pressure, heart disease, irregular heart beat -certain medicines for depression, anxiety, or psychotic disturbances -certain medicines for migraine headache like almotriptan, eletriptan, frovatriptan, naratriptan, rizatriptan, sumatriptan, zolmitriptan -certain medicines that treat or prevent blood clots like warfarin, enoxaparin, and dalteparin -cimetidine -fentanyl -lithium -NSAIDS, medicines for pain and inflammation, like ibuprofen or naproxen -phentermine -procarbazine -rasagiline -sibutramine -St. John's wort -theophylline -tramadol -tryptophan This list may not describe all possible interactions. Give your health care provider a list of all the medicines, herbs, non-prescription drugs, or dietary supplements you use. Also tell them if you smoke, drink alcohol, or use illegal drugs. Some items may interact with your medicine. What should I watch for while using this medicine? Tell your doctor if your symptoms do not get better or if they get worse. Visit your doctor or health care professional for regular checks on your progress. Because it may take several weeks to see the full effects of this medicine, it is important to  continue your treatment as prescribed by your doctor. Patients and their families should watch out for new or worsening thoughts of suicide or depression. Also watch out for sudden changes in feelings such as feeling anxious, agitated, panicky, irritable, hostile, aggressive, impulsive, severely restless, overly excited and hyperactive, or not being able to sleep. If this happens, especially at the beginning of treatment or after a change in dose, call your health care professional. Bonita Quin may get drowsy or dizzy. Do not drive, use machinery, or do anything that needs mental alertness until you know how this medicine affects you. Do not stand or sit up quickly, especially if you are an older patient. This reduces the risk of dizzy or fainting spells. Alcohol may interfere with the effect of this medicine. Avoid alcoholic drinks. This medicine can cause an increase in blood pressure. This medicine can also cause a sudden drop in your blood pressure, which may make you feel faint and increase the chance of a fall. These effects are most common when you first start the medicine or when the dose is increased, or during use of other medicines that can cause a sudden drop in blood pressure. Check with your doctor for instructions on monitoring your blood pressure while taking this medicine. Your mouth may get dry. Chewing sugarless gum or sucking hard candy, and drinking plenty of water may help. Contact your doctor if the problem does not go away or is severe. What side effects may I notice from receiving this medicine? Side effects that you should report to your  doctor or health care professional as soon as possible: -allergic reactions like skin rash, itching or hives, swelling of the face, lips, or tongue -anxious -breathing problems -confusion -changes in vision -chest pain -confusion -elevated mood, decreased need for sleep, racing thoughts, impulsive behavior -eye pain -fast, irregular  heartbeat -feeling faint or lightheaded, falls -feeling agitated, angry, or irritable -hallucination, loss of contact with reality -high blood pressure -loss of balance or coordination -palpitations -redness, blistering, peeling or loosening of the skin, including inside the mouth -restlessness, pacing, inability to keep still -seizures -stiff muscles -suicidal thoughts or other mood changes -trouble passing urine or change in the amount of urine -trouble sleeping -unusual bleeding or bruising -unusually weak or tired -vomiting -yellowing of the eyes or skin Side effects that usually do not require medical attention (report to your doctor or health care professional if they continue or are bothersome): -change in sex drive or performance -change in appetite or weight -constipation -dizziness -dry mouth -headache -increased sweating -nausea -tired This list may not describe all possible side effects. Call your doctor for medical advice about side effects. You may report side effects to FDA at 1-800-FDA-1088. Where should I keep my medicine? Keep out of the reach of children. Store at room temperature between 20 and 25 degrees C (68 to 77 degrees F). Throw away any unused medicine after the expiration date. NOTE: This sheet is a summary. It may not cover all possible information. If you have questions about this medicine, talk to your doctor, pharmacist, or health care provider.  2018 Elsevier/Gold Standard (2015-07-20 18:16:03)

## 2017-11-14 NOTE — Progress Notes (Signed)
[facility]

## 2017-12-23 ENCOUNTER — Telehealth: Payer: Self-pay | Admitting: Obstetrics and Gynecology

## 2017-12-23 DIAGNOSIS — Z23 Encounter for immunization: Secondary | ICD-10-CM | POA: Diagnosis not present

## 2017-12-23 NOTE — Telephone Encounter (Signed)
Patient called and cancelled her appointment with Dr. Edward Jolly on 12/26/17 for a 6 week recheck. She said she's doing great and will have to call back to reschedule.

## 2017-12-23 NOTE — Telephone Encounter (Signed)
Thank you for the update.  Encounter closed. 

## 2017-12-26 ENCOUNTER — Ambulatory Visit: Payer: 59 | Admitting: Obstetrics and Gynecology

## 2018-01-03 ENCOUNTER — Other Ambulatory Visit: Payer: Self-pay | Admitting: Obstetrics and Gynecology

## 2018-01-05 NOTE — Telephone Encounter (Signed)
Medication refill request: cymbalta  Last AEX:  03/13/17 DL Next AEX: 06/11/79 DL Last MMG (if hormonal medication request): 08-12-14 category c density birads 2:neg Refill authorized: 11/12/17 #30/5R. To [facility]. Requesting 90 day supply. Please advise.

## 2018-03-26 ENCOUNTER — Ambulatory Visit (INDEPENDENT_AMBULATORY_CARE_PROVIDER_SITE_OTHER): Payer: 59 | Admitting: Certified Nurse Midwife

## 2018-03-26 ENCOUNTER — Encounter: Payer: Self-pay | Admitting: Certified Nurse Midwife

## 2018-03-26 VITALS — BP 116/64 | HR 66 | Resp 14 | Ht 67.75 in | Wt 181.0 lb

## 2018-03-26 DIAGNOSIS — Z8659 Personal history of other mental and behavioral disorders: Secondary | ICD-10-CM | POA: Diagnosis not present

## 2018-03-26 DIAGNOSIS — B009 Herpesviral infection, unspecified: Secondary | ICD-10-CM

## 2018-03-26 DIAGNOSIS — Z01419 Encounter for gynecological examination (general) (routine) without abnormal findings: Secondary | ICD-10-CM

## 2018-03-26 MED ORDER — DULOXETINE HCL 30 MG PO CPEP: ORAL_CAPSULE | ORAL | 2 refills | Status: DC

## 2018-03-26 MED ORDER — VALACYCLOVIR HCL 500 MG PO TABS: ORAL_TABLET | ORAL | 12 refills | Status: DC

## 2018-03-26 NOTE — Patient Instructions (Signed)

## 2018-03-26 NOTE — Progress Notes (Signed)
43 y.o. [id number] Single  Mixed  Black  Fe here for annual exam. Periods normal shorter now. Aware of mammogram being due. Plans to schedule soon. Had a HSV outbreak in the past month or two,used Valtrex with good results. Needs update on RX. Not sexually active. Cymbalta working well for depression, will need update on Rx. Busy with daughters, oldest in [place] now! Sees urgent care if needed. No other health issues today.  No LMP recorded.          Sexually active: No The current method of family planning is abstinence.    Exercising: Yes.    Dancing once a week. Smoker:  yes  Review of Systems  All other systems reviewed and are negative.   Health Maintenance: Pap:  02-21-15 neg HPV HR neg, 03-13-17 neg HPV HR neg History of Abnormal Pap: yes MMG:  08-12-14 category c density birads 2:neg Self Breast exams: yes Colonoscopy:  no BMD:   no TDaP:  2012 Shingles: no Pneumonia: no Hep C and HIV: Hep c neg 2016 Labs: if needed   reports that she has quit smoking. She has never used smokeless tobacco. She reports current alcohol use of about 1.0 - 2.0 standard drinks of alcohol per week. She reports that she does not use drugs.  Past Medical History:  Diagnosis Date  . ASCUS with positive high risk HPV   . Depression   . Exercise-induced asthma   . Migraine with aura   . Smoker     Past Surgical History:  Procedure Laterality Date  . 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 TOTAL HIP ARTHROPLASTY  left    Current Outpatient Medications  Medication Sig Dispense Refill  . BIOTIN PO Take by mouth daily.     . DULoxetine (CYMBALTA) 30 MG capsule TAKE 1 CAPSULE BY MOUTH EVERY DAY 90 capsule 1  . folic acid (FOLVITE) 400 MCG tablet Take 400 mcg by mouth daily.    . Multiple Vitamins-Minerals (MULTIVITAMIN WITH MINERALS) tablet Take 1 tablet by mouth daily.    . promethazine (PHENERGAN) 25 MG tablet TAKE 1 TABLET BY MOUTH EVERY 12 HOURS AS NEEDED FOR 5 DAYS  5  .  SUMAtriptan (IMITREX) 50 MG tablet as needed.   1  . triamcinolone (KENALOG) 0.025 % ointment Apply 1 application topically 2 (two) times daily. 30 g 1  . valACYclovir (VALTREX) 500 MG tablet Take one tablet bid at onset of outbreak for 3 days 30 tablet 12   No current facility-administered medications for this visit.     Family History  Problem Relation Age of Onset  . Migraines Maternal Grandmother   . Heart disease Maternal Grandmother   . Hodgkin's lymphoma Maternal Grandfather     ROS:  Pertinent items are noted in HPI.  Otherwise, a comprehensive ROS was negative.  Exam:   There were no vitals taken for this visit.   Ht Readings from Last 3 Encounters:  11/12/17 5' 7.75" (1.721 m)  03/13/17 5\' 7"  (1.702 m)  11/19/16 5' 7.25" (1.708 m)    General appearance: alert, cooperative and appears stated age Head: Normocephalic, without obvious abnormality, atraumatic Neck: no adenopathy, supple, symmetrical, trachea midline and thyroid normal to inspection and palpation Lungs: clear to auscultation bilaterally Breasts: normal appearance, no masses or tenderness, No nipple retraction or dimpling, No nipple discharge or bleeding, No axillary or supraclavicular adenopathy Heart: regular rate and rhythm Abdomen: soft, non-tender; no masses,  no organomegaly  Extremities: extremities normal, atraumatic, no cyanosis or edema Skin: Skin color, texture, turgor normal. No rashes or lesions Lymph nodes: Cervical, supraclavicular, and axillary nodes normal. No abnormal inguinal nodes palpated Neurologic: Grossly normal   Pelvic: External genitalia:  no lesions              Urethra:  normal appearing urethra with no masses, tenderness or lesions              Bartholin's and Skene's: normal                 Vagina: normal appearing vagina with normal color and discharge, no lesions              Cervix: multiparous appearance, no cervical motion tenderness and no lesions              Pap  taken: No. Bimanual Exam:  Uterus:  normal size, contour, position, consistency, mobility, non-tender and anteverted              Adnexa: normal adnexa and no mass, fullness, tenderness               Rectovaginal: Confirms               Anus:  normal sphincter tone, no lesions  Chaperone present: yes  A:  Well Woman with normal exam  Contraception none needed, not sexually active  Cymbalta working well for depression now  History of HSV Valtrex working well  Mammogram overdue  P:   Reviewed health and wellness pertinent to exam  Aware of risks/benefits and desires continuance.  Rx Cymbalta see order with instructions  Rx Valtrex see order with instructions  Discussed importance of SBE and mammogram, patient will schedule.  Pap smear: no   counseled on breast self exam, mammography screening, adequate intake of calcium and vitamin D, diet and exercise  return annually or prn  An After Visit Summary was printed and given to the patient.

## 2018-05-01 ENCOUNTER — Other Ambulatory Visit: Payer: Self-pay | Admitting: Certified Nurse Midwife

## 2018-05-01 DIAGNOSIS — Z1231 Encounter for screening mammogram for malignant neoplasm of breast: Secondary | ICD-10-CM

## 2018-05-04 ENCOUNTER — Ambulatory Visit
Admission: RE | Admit: 2018-05-04 | Discharge: 2018-05-04 | Disposition: A | Discharge status: Home / Self Care | Payer: 59 | Source: Ambulatory Visit

## 2018-05-04 DIAGNOSIS — Z1231 Encounter for screening mammogram for malignant neoplasm of breast: Secondary | ICD-10-CM | POA: Diagnosis not present

## 2019-03-29 ENCOUNTER — Other Ambulatory Visit: Payer: Self-pay

## 2019-03-29 ENCOUNTER — Encounter: Payer: Self-pay | Admitting: Orthopaedic Surgery

## 2019-03-29 ENCOUNTER — Ambulatory Visit (INDEPENDENT_AMBULATORY_CARE_PROVIDER_SITE_OTHER): Payer: BC Managed Care – PPO

## 2019-03-29 ENCOUNTER — Ambulatory Visit (INDEPENDENT_AMBULATORY_CARE_PROVIDER_SITE_OTHER): Payer: BC Managed Care – PPO | Admitting: Orthopaedic Surgery

## 2019-03-29 DIAGNOSIS — M25551 Pain in right hip: Secondary | ICD-10-CM | POA: Diagnosis not present

## 2019-03-29 NOTE — Progress Notes (Signed)
Office Visit Note   Patient: Michelle Ewing           Date of Birth: 1975/05/24           MRN: [id number] Visit Date: 03/29/2019              Requested by: Aura Dials, [address] [place],  [place] [address] PCP: Aura Dials, MD   Assessment & Plan: Visit Diagnoses:  1. Pain in right hip     Plan: Given her hip pain and discomfort on the right side, I am recommending a MRI arthrogram to assess the labrum of the right hip as well as the cartilage in general.  We will be able to compare this to her previous MRI from 2017.  I do feel this is the most appropriate step given the disease that was seen in her left hip the lead to a total hip arthroplasty.  She agrees with this treatment plan.  All question concerns were answered addressed.  We will see her back after the MRI arthrogram of her right hip.  Follow-Up Instructions: No follow-ups on file.  Follow-up will be after a MRI arthrograms obtained of the right hip.  Orders:  Orders Placed This Encounter  Procedures  . XR HIP UNILAT W OR W/O PELVIS 2-3 VIEWS RIGHT   No orders of the defined types were placed in this encounter.     Procedures: No procedures performed   Clinical Data: No additional findings.   Subjective: Chief Complaint  Patient presents with  . Right Hip - Pain  The patient is very active and pleasant 44 year old female with debilitating right hip pain.  Hurts with pivoting activities and weightbearing.  It hurts in the groin.  She does have an interesting history and the fact that in 2017 she had a left total hip arthroplasty.  This was due to a degenerative labral tear and degenerative changes in the hip.  She has done very well with that left hip and is very satisfied with it.  She had this performed at [facility].  She recently did some local hiking with a friend whose husband had died.  Her hip pain is worsened since then.  Is been slowly getting worse for the last 3 months.  She has had pain  for about a year now in her right hip.  She is otherwise very active and healthy 44 year old with no acute medical issues.  HPI  Review of Systems She currently denies any headache, chest pain, shortness of breath, fever, chills, nausea, vomiting  Objective: Vital Signs: There were no vitals taken for this visit.  Physical Exam She is alert and orient x3 and in no acute distress Ortho Exam Examination of her right hip shows full internal and external rotation with some slight pain in the groin with the extremes of rotation.  The remainder of her exam Specialty Comments:  No specialty comments available.  Imaging: XR HIP UNILAT W OR W/O PELVIS 2-3 VIEWS RIGHT  Result Date: 03/29/2019 A low AP pelvis and lateral of the right hip shows well-maintained joint space.  There is questionable cystic change in the roof of the acetabulum and questionable in the femoral head.  The joint space though itself is nice and congruent.  Independent review of the previous MRI done of her left hip also showed the right hip.  The right hip had only a small cystic change in the roof of the acetabulum but the joint look  good overall.  PMFS History: Patient Active Problem List   Diagnosis Date Noted  . Follow-up examination after orthopedic surgery 11/05/2016  . Aftercare following left hip joint replacement surgery 11/17/2015  . History of total left hip arthroplasty 11/17/2015  . Status post left hip replacement 10/31/2015  . Migraine 10/18/2015  . Primary osteoarthritis of left hip 09/28/2015   Past Medical History:  Diagnosis Date  . ASCUS with positive high risk HPV   . Depression   . Exercise-induced asthma   . Migraine with aura   . Smoker     Family History  Problem Relation Age of Onset  . Migraines Maternal Grandmother   . Heart disease Maternal Grandmother   . Hodgkin's lymphoma Maternal Grandfather     Past Surgical History:  Procedure Laterality Date  . COLPOSCOPY  11/28/10    With hx CIN , 2014 LGSIL  . TOTAL HIP ARTHROPLASTY  left   Social History   Occupational History  . Not on file  Tobacco Use  . Smoking status: Former Games developer  . Smokeless tobacco: Never Used  Substance and Sexual Activity  . Alcohol use: Yes    Alcohol/week: 1.0 - 2.0 standard drinks    Types: 1 - 2 Standard drinks or equivalent per week  . Drug use: No  . Sexual activity: Not Currently    Partners: Male    Birth control/protection: Condom

## 2019-03-30 ENCOUNTER — Other Ambulatory Visit: Payer: Self-pay

## 2019-03-31 NOTE — Progress Notes (Signed)
44 y.o. G19P203 Single Biracial/ Caucasian Fe here for annual exam. Periods are normal, no issues. Aware of weight gain, due to Right hip issues after 6 miles hike. Has seen Orthopedic regarding, MRI scheduled. No HSV outbreaks. Migraines with aura have not changed. Some constipation and started on probiotic with some change, no blood in stool or tarry stools. Not sexually active now and desires screening. Sees PCP for labs and medication management for depression/migraine. All stable per patient. No other health issues today.  Patient's last menstrual period was 03/07/2019 (exact date).          Sexually active: No.  The current method of family planning is abstinence.    Exercising: No.  exercise Smoker:  no  Review of Systems  Constitutional: Negative.        Weight gain  HENT: Negative.   Eyes: Negative.   Respiratory: Negative.   Cardiovascular: Negative.   Gastrointestinal: Positive for constipation.  Genitourinary: Negative.   Musculoskeletal: Negative.   Skin: Negative.   Neurological: Negative.   Endo/Heme/Allergies: Negative.   Psychiatric/Behavioral: Negative.     Health Maintenance: Pap:  03-13-17 neg HPV HR neg History of Abnormal Pap: yes MMG:  05-05-2018 category d density birads 1:neg Self Breast exams: no Colonoscopy:  no BMD:   no TDaP:  2012 Shingles: no Pneumonia: no Hep C and HIV: hep c neg 2016 Labs: yes   reports that she has quit smoking. She has never used smokeless tobacco. She reports current alcohol use. She reports that she does not use drugs.  Past Medical History:  Diagnosis Date  . ASCUS with positive high risk HPV   . Depression   . Exercise-induced asthma   . Migraine with aura   . Smoker     Past Surgical History:  Procedure Laterality Date  . 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 TOTAL HIP ARTHROPLASTY  left    Current Outpatient Medications  Medication Sig Dispense Refill  . BIOTIN PO Take 5,000 mcg by mouth daily.      . Cholecalciferol (VITAMIN D3 PO) Take 100 mcg by mouth.    . DULoxetine (CYMBALTA) 30 MG capsule TAKE 1 CAPSULE BY MOUTH EVERY DAY 90 capsule 2  . folic acid (FOLVITE) 400 MCG tablet Take 400 mcg by mouth daily.    . Multiple Vitamins-Minerals (MULTIVITAMIN WITH MINERALS) tablet Take 1 tablet by mouth daily.    . Probiotic Product (PROBIOTIC PO) Take by mouth.    Marland Kitchen ZINC-VITAMIN C PO Take by mouth.    . SUMAtriptan (IMITREX) 50 MG tablet as needed.   1  . valACYclovir (VALTREX) 500 MG tablet Take one tablet bid at onset of outbreak for 3 days (Patient not taking: Reported on 04/02/2019) 30 tablet 12   No current facility-administered medications for this visit.    Family History  Problem Relation Age of Onset  . Migraines Maternal Grandmother   . Heart disease Maternal Grandmother   . Hodgkin's lymphoma Maternal Grandfather     ROS:  Pertinent items are noted in HPI.  Otherwise, a comprehensive ROS was negative.  Exam:   BP 120/78   Pulse 68   Temp 98.7 F (37.1 C) (Skin)   Resp 16   Ht 5' 6.75" (1.695 m)   Wt 192 lb (87.1 kg)   LMP 03/07/2019 (Exact Date)   BMI 30.30 kg/m  Height: 5' 6.75" (169.5 cm) Ht Readings from Last 3 Encounters:  04/02/19 5' 6.75" (1.695  m)  03/26/18 5' 7.75" (1.721 m)  11/12/17 5' 7.75" (1.721 m)    General appearance: alert, cooperative and appears stated age Head: Normocephalic, without obvious abnormality, atraumatic Neck: no adenopathy, supple, symmetrical, trachea midline and thyroid normal to inspection and palpation Lungs: clear to auscultation bilaterally Breasts: normal appearance, no masses or tenderness, No nipple retraction or dimpling, No nipple discharge or bleeding, No axillary or supraclavicular adenopathy Heart: regular rate and rhythm Abdomen: soft, non-tender; no masses,  no organomegaly Extremities: extremities normal, atraumatic, no cyanosis or edema Skin: Skin color, texture, turgor normal. No rashes or lesions Lymph  nodes: Cervical, supraclavicular, and axillary nodes normal. No abnormal inguinal nodes palpated Neurologic: Grossly normal   Pelvic: External genitalia:  no lesions              Urethra:  normal appearing urethra with no masses, tenderness or lesions              Bartholin's and Skene's: normal                 Vagina: normal appearing vagina with normal color and discharge, no lesions              Cervix: multiparous appearance, no cervical motion tenderness and no lesions              Pap taken: Yes.   Bimanual Exam:  Uterus:  normal size, contour, position, consistency, mobility, non-tender and anteverted              Adnexa: normal adnexa and no mass, fullness, tenderness               Rectovaginal: Confirms               Anus:  normal sphincter tone, no lesions  Chaperone present: yes  A:  Well Woman with normal exam  Contraception abstinence  Depression with PCP management  STD screening  Mammogram due    P:   Reviewed health and wellness pertinent to exam  Stressed condom use if sexually active.  Continue follow up with PCP as indicated  Labs: GC,Chlamydia, Affirm, HIV,RPR, Hep C, TSH, Vitamin D  Stressed SBE and yearly mammogram. Discussed D density of breast reviewed TC scoring and did not indicate a candidate for MRI screening. Discussed continue with 3 D mammograms yearly.  Pap smear: yes   counseled on breast self exam, mammography screening, STD prevention, HIV risk factors and prevention, feminine hygiene, adequate intake of calcium and vitamin D, diet and exercise  return annually or prn  An After Visit Summary was printed and given to the patient.

## 2019-04-02 ENCOUNTER — Ambulatory Visit (INDEPENDENT_AMBULATORY_CARE_PROVIDER_SITE_OTHER): Payer: BC Managed Care – PPO | Admitting: Certified Nurse Midwife

## 2019-04-02 ENCOUNTER — Other Ambulatory Visit: Payer: Self-pay

## 2019-04-02 ENCOUNTER — Other Ambulatory Visit (HOSPITAL_COMMUNITY)
Admission: RE | Admit: 2019-04-02 | Discharge: 2019-04-02 | Disposition: A | Discharge status: Home / Self Care | Payer: BC Managed Care – PPO | Source: Ambulatory Visit | Attending: Certified Nurse Midwife | Admitting: Certified Nurse Midwife

## 2019-04-02 ENCOUNTER — Encounter: Payer: Self-pay | Admitting: Certified Nurse Midwife

## 2019-04-02 VITALS — BP 120/78 | HR 68 | Temp 98.7°F | Resp 16 | Ht 66.75 in | Wt 192.0 lb

## 2019-04-02 DIAGNOSIS — E559 Vitamin D deficiency, unspecified: Secondary | ICD-10-CM

## 2019-04-02 DIAGNOSIS — Z113 Encounter for screening for infections with a predominantly sexual mode of transmission: Secondary | ICD-10-CM | POA: Diagnosis not present

## 2019-04-02 DIAGNOSIS — Z124 Encounter for screening for malignant neoplasm of cervix: Secondary | ICD-10-CM

## 2019-04-02 DIAGNOSIS — Z0000 Encounter for general adult medical examination without abnormal findings: Secondary | ICD-10-CM | POA: Diagnosis not present

## 2019-04-02 DIAGNOSIS — Z01419 Encounter for gynecological examination (general) (routine) without abnormal findings: Secondary | ICD-10-CM | POA: Diagnosis not present

## 2019-04-02 NOTE — Patient Instructions (Signed)

## 2019-04-03 ENCOUNTER — Other Ambulatory Visit: Payer: Self-pay | Admitting: Certified Nurse Midwife

## 2019-04-03 DIAGNOSIS — B9689 Other specified bacterial agents as the cause of diseases classified elsewhere: Secondary | ICD-10-CM

## 2019-04-03 LAB — TSH: TSH: 1.12 u[IU]/mL (ref 0.450–4.500)

## 2019-04-03 LAB — CBC
Hematocrit: 37.6 % (ref 34.0–46.6)
Hemoglobin: 13 g/dL (ref 11.1–15.9)
MCH: 30.4 pg (ref 26.6–33.0)
MCHC: 34.6 g/dL (ref 31.5–35.7)
MCV: 88 fL (ref 79–97)
Platelets: 324 10*3/uL (ref 150–450)
RBC: 4.28 x10E6/uL (ref 3.77–5.28)
RDW: 12.8 % (ref 11.7–15.4)
WBC: 7.2 10*3/uL (ref 3.4–10.8)

## 2019-04-03 LAB — LIPID PANEL
Chol/HDL Ratio: 2.6 ratio (ref 0.0–4.4)
Cholesterol, Total: 192 mg/dL (ref 100–199)
HDL: 74 mg/dL (ref >39)
LDL Chol Calc (NIH): 106 mg/dL — ABNORMAL HIGH (ref 0–99)
Triglycerides: 67 mg/dL (ref 0–149)
VLDL Cholesterol Cal: 12 mg/dL (ref 5–40)

## 2019-04-03 LAB — COMPREHENSIVE METABOLIC PANEL
ALT: 9 IU/L (ref 0–32)
AST: 16 IU/L (ref 0–40)
Albumin/Globulin Ratio: 1.5 (ref 1.2–2.2)
Albumin: 4.3 g/dL (ref 3.8–4.8)
Alkaline Phosphatase: 76 IU/L (ref 39–117)
BUN/Creatinine Ratio: 16 (ref 9–23)
BUN: 13 mg/dL (ref 6–24)
Bilirubin Total: 0.3 mg/dL (ref 0.0–1.2)
CO2: 24 mmol/L (ref 20–29)
Calcium: 9.6 mg/dL (ref 8.7–10.2)
Chloride: 101 mmol/L (ref 96–106)
Creatinine, Ser: 0.79 mg/dL (ref 0.57–1.00)
GFR calc Af Amer: 105 mL/min/{1.73_m2} (ref >59)
GFR calc non Af Amer: 91 mL/min/{1.73_m2} (ref >59)
Globulin, Total: 2.9 g/dL (ref 1.5–4.5)
Glucose: 93 mg/dL (ref 65–99)
Potassium: 4.2 mmol/L (ref 3.5–5.2)
Sodium: 137 mmol/L (ref 134–144)
Total Protein: 7.2 g/dL (ref 6.0–8.5)

## 2019-04-03 LAB — VITAMIN D 25 HYDROXY (VIT D DEFICIENCY, FRACTURES): Vit D, 25-Hydroxy: 45.4 ng/mL (ref 30.0–100.0)

## 2019-04-03 LAB — VAGINITIS/VAGINOSIS, DNA PROBE
Candida Species: NEGATIVE
Gardnerella vaginalis: POSITIVE — AB
Trichomonas vaginosis: NEGATIVE

## 2019-04-03 LAB — HEPATITIS C ANTIBODY: Hep C Virus Ab: <0.1 s/co ratio (ref 0.0–0.9)

## 2019-04-03 LAB — HIV ANTIBODY (ROUTINE TESTING W REFLEX): HIV Screen 4th Generation wRfx: NONREACTIVE

## 2019-04-03 LAB — RPR: RPR Ser Ql: NONREACTIVE

## 2019-04-03 MED ORDER — METRONIDAZOLE 500 MG PO TABS: 500.0000 mg | ORAL_TABLET | Freq: Two times a day (BID) | ORAL | 0 refills | Status: DC

## 2019-04-06 LAB — CYTOLOGY - PAP
Chlamydia: NEGATIVE
Comment: NEGATIVE
Comment: NORMAL
Diagnosis: NEGATIVE
Neisseria Gonorrhea: NEGATIVE

## 2019-04-07 ENCOUNTER — Other Ambulatory Visit: Payer: Self-pay | Admitting: Certified Nurse Midwife

## 2019-04-07 DIAGNOSIS — Z8659 Personal history of other mental and behavioral disorders: Secondary | ICD-10-CM

## 2019-04-23 ENCOUNTER — Ambulatory Visit
Admission: RE | Admit: 2019-04-23 | Discharge: 2019-04-23 | Disposition: A | Discharge status: Home / Self Care | Payer: BC Managed Care – PPO | Source: Ambulatory Visit | Attending: Orthopaedic Surgery | Admitting: Orthopaedic Surgery

## 2019-04-23 DIAGNOSIS — M25551 Pain in right hip: Secondary | ICD-10-CM

## 2019-04-23 MED ORDER — IOPAMIDOL (ISOVUE-M 200) INJECTION 41%
10.0000 mL | Freq: Once | INTRAMUSCULAR | Status: AC
  Administered 2019-04-23: 15:00:00 10 mL via INTRA_ARTICULAR

## 2019-04-27 ENCOUNTER — Other Ambulatory Visit: Payer: Self-pay

## 2019-04-27 ENCOUNTER — Ambulatory Visit (INDEPENDENT_AMBULATORY_CARE_PROVIDER_SITE_OTHER): Payer: BC Managed Care – PPO | Admitting: Orthopaedic Surgery

## 2019-04-27 ENCOUNTER — Encounter: Payer: Self-pay | Admitting: Orthopaedic Surgery

## 2019-04-27 DIAGNOSIS — M1611 Unilateral primary osteoarthritis, right hip: Secondary | ICD-10-CM | POA: Insufficient documentation

## 2019-04-27 NOTE — Progress Notes (Signed)
The patient is a 44 year old female who is returning for follow-up after having a MRI arthrogram of her right hip.  What is interesting is the fact that she did have a left total hip arthroplasty in 2017 due to severe degenerative changes in her left hip.  She has been developing right hip pain for over a year and the last 3 months has gotten significantly worse.  She has a lot of pain in the groin on the right side.  There has been a stabbing pain with rotation of her hip.  At this point her right hip pain is detrimentally affecting her mobility, her quality of life and activities daily living.  It can be 10 out of 10 at times.  She has tried and failed other forms of conservative treatment including activity modification and anti-inflammatories as well as a steroid injection.  On exam her right hip does move fluidly but it is very painful in the groin with internal and external rotation.  MRIs reviewed with her and it does show age advanced arthritic changes throughout the right hip.  I can see cystic changes in the femoral head and in the subchondral bone of the acetabulum.  There is extensive degenerative tearing of the anterior and superior labrum of the right hip.  There is cartilage loss as well of the femoral head.  At this point the only recommendation for her would be hip replacement surgery.  She is also tried hip strengthening exercises and that has not helped.  With pain with weightbearing and other activities daily living she does wish proceed with total hip arthroplasty in the near future.  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  I gave her our surgery scheduler's card.  All question concerns were answered and addressed.  She will call us to let us know when she would like to be scheduled for a right anterior hip replacement.

## 2019-05-07 ENCOUNTER — Other Ambulatory Visit: Payer: Self-pay | Admitting: Physician Assistant

## 2019-05-10 ENCOUNTER — Other Ambulatory Visit: Payer: Self-pay

## 2019-05-10 ENCOUNTER — Other Ambulatory Visit: Payer: Self-pay | Admitting: Certified Nurse Midwife

## 2019-05-10 DIAGNOSIS — Z8659 Personal history of other mental and behavioral disorders: Secondary | ICD-10-CM

## 2019-05-10 NOTE — Telephone Encounter (Signed)
Medication refill request: Cymbalta Last AEX:  04-02-19 DL  Next AEX: [id number] Last MMG (if hormonal medication request): n/a Refill authorized: Today, please advise.   Medication pended for #90, 2RF. Please refill if appropriate.

## 2019-05-10 NOTE — Telephone Encounter (Signed)
Detailed message left per DPR advising patient that refill of Cymbalta sent to pharmacy on file. Advised to return call if any additional questions.   Encounter closed.

## 2019-05-10 NOTE — Telephone Encounter (Signed)
Patient checking status of refill request. °

## 2019-05-11 NOTE — Patient Instructions (Addendum)
DUE TO COVID-19 ONLY ONE VISITOR IS ALLOWED TO COME WITH YOU AND STAY IN THE WAITING ROOM ONLY DURING PRE OP AND PROCEDURE DAY OF SURGERY. THE 1 VISITOR MAY VISIT WITH YOU AFTER SURGERY IN YOUR PRIVATE ROOM DURING VISITING HOURS ONLY!  YOU NEED TO HAVE A COVID 19 TEST ON_Tuesday 03/16/2021______ @__1230  pm_____, THIS TEST MUST BE DONE BEFORE SURGERY, COME  [address], [place] , [address].  ([place]) ONCE YOUR COVID TEST IS COMPLETED, PLEASE BEGIN THE QUARANTINE INSTRUCTIONS AS OUTLINED IN YOUR HANDOUT.                [place]     Your procedure is scheduled on: Friday 05/21/2019   Report to [facility] Main  Entrance    Report to admitting at   0600 AM     Call this number if you have problems the morning of surgery [phone]    Remember: Do not eat food  :After Midnight.     NO SOLID FOOD AFTER MIDNIGHT THE NIGHT PRIOR TO SURGERY  And    NOTHING BY MOUTH EXCEPT CLEAR LIQUIDS UNTIL  0530 am .    PLEASE FINISH ENSURE DRINK PER SURGEON ORDER  WHICH NEEDS TO BE COMPLETED AT  0530 am .   CLEAR LIQUID DIET   Foods Allowed                                                                     Foods Excluded  Coffee and tea, regular and decaf                             liquids that you cannot  Plain Jell-O any favor except red or purple                                           see through such as: Fruit ices (not with fruit pulp)                                     milk, soups, orange juice  Iced Popsicles                                    All solid food Carbonated beverages, regular and diet                                    Cranberry, grape and apple juices Sports drinks like Gatorade Lightly seasoned clear broth or consume(fat free) Sugar, honey syrup  Sample Menu Breakfast                                Lunch  Supper Cranberry juice                    Beef broth                            Chicken  broth Jell-O                                     Grape juice                           Apple juice Coffee or tea                        Jell-O                                      Popsicle                                                Coffee or tea                        Coffee or tea  _____________________________________________________________________    BRUSH YOUR TEETH MORNING OF SURGERY AND RINSE YOUR MOUTH OUT, NO CHEWING GUM CANDY OR MINTS.     Take these medicines the morning of surgery with A SIP OF WATER: Duloxetine (Cymbalta)                                You may not have any metal on your body including hair pins and              piercings  Do not wear jewelry, make-up, lotions, powders or perfumes, deodorant             Do not wear nail polish on your fingernails.  Do not shave  48 hours prior to surgery.                Do not bring valuables to the hospital. [facility] IS NOT             RESPONSIBLE   FOR VALUABLES.  Contacts, dentures or bridgework may not be worn into surgery.  Leave suitcase in the car. After surgery it may be brought to your room.                  Please read over the following fact sheets you were given: _____________________________________________________________________             [facility] - Preparing for Surgery Before surgery, you can play an important role.  Because skin is not sterile, your skin needs to be as free of germs as possible.  You can reduce the number of germs on your skin by washing with CHG (chlorahexidine gluconate) soap before surgery.  CHG is an antiseptic cleaner which kills germs and bonds with the skin to continue killing germs even after washing. Please DO NOT use if you have an allergy to CHG or antibacterial soaps.  If your skin  becomes reddened/irritated stop using the CHG and inform your nurse when you arrive at Short Stay. Do not shave (including legs and underarms) for at least 48 hours prior to the first  CHG shower.  You may shave your face/neck. Please follow these instructions carefully:  1.  Shower with CHG Soap the night before surgery and the  morning of Surgery.  2.  If you choose to wash your hair, wash your hair first as usual with your  normal  shampoo.  3.  After you shampoo, rinse your hair and body thoroughly to remove the  shampoo.                           4.  Use CHG as you would any other liquid soap.  You can apply chg directly  to the skin and wash                       Gently with a scrungie or clean washcloth.  5.  Apply the CHG Soap to your body ONLY FROM THE NECK DOWN.   Do not use on face/ open                           Wound or open sores. Avoid contact with eyes, ears mouth and genitals (private parts).                       Wash face,  Genitals (private parts) with your normal soap.             6.  Wash thoroughly, paying special attention to the area where your surgery  will be performed.  7.  Thoroughly rinse your body with warm water from the neck down.  8.  DO NOT shower/wash with your normal soap after using and rinsing off  the CHG Soap.                9.  Pat yourself dry with a clean towel.            10.  Wear clean pajamas.            11.  Place clean sheets on your bed the night of your first shower and do not  sleep with pets. Day of Surgery : Do not apply any lotions/deodorants the morning of surgery.  Please wear clean clothes to the hospital/surgery center.  FAILURE TO FOLLOW THESE INSTRUCTIONS MAY RESULT IN THE CANCELLATION OF YOUR SURGERY PATIENT SIGNATURE_________________________________  NURSE SIGNATURE__________________________________  ________________________________________________________________________   Michelle Ewing  An incentive spirometer is a tool that can help keep your lungs clear and active. This tool measures how well you are filling your lungs with each breath. Taking long deep breaths may help reverse or decrease the  chance of developing breathing (pulmonary) problems (especially infection) following:  A long period of time when you are unable to move or be active. BEFORE THE PROCEDURE   If the spirometer includes an indicator to show your best effort, your nurse or respiratory therapist will set it to a desired goal.  If possible, sit up straight or lean slightly forward. Try not to slouch.  Hold the incentive spirometer in an upright position. INSTRUCTIONS FOR USE  1. Sit on the edge of your bed if possible, or sit up as far as you can in bed or on a chair. 2.  Hold the incentive spirometer in an upright position. 3. Breathe out normally. 4. Place the mouthpiece in your mouth and seal your lips tightly around it. 5. Breathe in slowly and as deeply as possible, raising the piston or the ball toward the top of the column. 6. Hold your breath for 3-5 seconds or for as long as possible. Allow the piston or ball to fall to the bottom of the column. 7. Remove the mouthpiece from your mouth and breathe out normally. 8. Rest for a few seconds and repeat Steps 1 through 7 at least 10 times every 1-2 hours when you are awake. Take your time and take a few normal breaths between deep breaths. 9. The spirometer may include an indicator to show your best effort. Use the indicator as a goal to work toward during each repetition. 10. After each set of 10 deep breaths, practice coughing to be sure your lungs are clear. If you have an incision (the cut made at the time of surgery), support your incision when coughing by placing a pillow or rolled up towels firmly against it. Once you are able to get out of bed, walk around indoors and cough well. You may stop using the incentive spirometer when instructed by your caregiver.  RISKS AND COMPLICATIONS  Take your time so you do not get dizzy or light-headed.  If you are in pain, you may need to take or ask for pain medication before doing incentive spirometry. It is harder  to take a deep breath if you are having pain. AFTER USE  Rest and breathe slowly and easily.  It can be helpful to keep track of a log of your progress. Your caregiver can provide you with a simple table to help with this. If you are using the spirometer at home, follow these instructions: [place] IF:   You are having difficultly using the spirometer.  You have trouble using the spirometer as often as instructed.  Your pain medication is not giving enough relief while using the spirometer.  You develop fever of 100.5 F (38.1 C) or higher. SEEK IMMEDIATE MEDICAL CARE IF:   You cough up bloody sputum that had not been present before.  You develop fever of 102 F (38.9 C) or greater.  You develop worsening pain at or near the incision site. MAKE SURE YOU:   Understand these instructions.  Will watch your condition.  Will get help right away if you are not doing well or get worse. Document Released: 07/01/2006 Document Revised: 05/13/2011 Document Reviewed: 09/01/2006 [facility] Patient Information 2014 [place], [place].   ________________________________________________________________________

## 2019-05-12 ENCOUNTER — Encounter (HOSPITAL_COMMUNITY)
Admission: RE | Admit: 2019-05-12 | Discharge: 2019-05-12 | Disposition: A | Discharge status: Home / Self Care | Payer: BC Managed Care – PPO | Source: Ambulatory Visit | Attending: Orthopaedic Surgery | Admitting: Orthopaedic Surgery

## 2019-05-12 ENCOUNTER — Other Ambulatory Visit: Payer: Self-pay

## 2019-05-12 ENCOUNTER — Encounter (HOSPITAL_COMMUNITY): Payer: Self-pay

## 2019-05-12 DIAGNOSIS — Z01812 Encounter for preprocedural laboratory examination: Secondary | ICD-10-CM | POA: Diagnosis not present

## 2019-05-12 HISTORY — DX: Unspecified osteoarthritis, unspecified site: M19.90

## 2019-05-12 LAB — CBC
HCT: 40.3 % (ref 36.0–46.0)
Hemoglobin: 13 g/dL (ref 12.0–15.0)
MCH: 29.8 pg (ref 26.0–34.0)
MCHC: 32.3 g/dL (ref 30.0–36.0)
MCV: 92.4 fL (ref 80.0–100.0)
Platelets: 312 10*3/uL (ref 150–400)
RBC: 4.36 MIL/uL (ref 3.87–5.11)
RDW: 13.2 % (ref 11.5–15.5)
WBC: 6.6 10*3/uL (ref 4.0–10.5)
nRBC: 0 % (ref 0.0–0.2)

## 2019-05-12 LAB — BASIC METABOLIC PANEL
Anion gap: 9 (ref 5–15)
BUN: 16 mg/dL (ref 6–20)
CO2: 26 mmol/L (ref 22–32)
Calcium: 9.3 mg/dL (ref 8.9–10.3)
Chloride: 104 mmol/L (ref 98–111)
Creatinine, Ser: 0.87 mg/dL (ref 0.44–1.00)
GFR calc Af Amer: >60 mL/min (ref >60)
GFR calc non Af Amer: >60 mL/min (ref >60)
Glucose, Bld: 104 mg/dL — ABNORMAL HIGH (ref 70–99)
Potassium: 4.4 mmol/L (ref 3.5–5.1)
Sodium: 139 mmol/L (ref 135–145)

## 2019-05-12 LAB — SURGICAL PCR SCREEN
MRSA, PCR: NEGATIVE
Staphylococcus aureus: NEGATIVE

## 2019-05-12 NOTE — Progress Notes (Signed)
PCP - Dr. Henrine Screws Cardiologist - n/a  Chest x-ray - n/a EKG - n/a Stress Test - n/a ECHO - n/a Cardiac Cath - n/a  Sleep Study - n/a CPAP - n/a  Fasting Blood Sugar - n/a Checks Blood Sugar _0____ times a day  Blood Thinner Instructions:n/a Aspirin Instructions:n/a Last Dose:n/a Patient does take Ibuprofen (Advil) and states the last dose was 05/10/2019.  Anesthesia review:  Patient has a history of depression, migraines with aura and exercise induced asthma.  Patient denies shortness of breath, fever, cough and chest pain at PAT appointment   Patient verbalized understanding of instructions that were given to them at the PAT appointment. Patient was also instructed that they will need to review over the PAT instructions again at home before surgery.

## 2019-05-18 ENCOUNTER — Other Ambulatory Visit (HOSPITAL_COMMUNITY)
Admission: RE | Admit: 2019-05-18 | Discharge: 2019-05-18 | Disposition: A | Discharge status: Home / Self Care | Payer: BC Managed Care – PPO | Source: Ambulatory Visit | Attending: Orthopaedic Surgery | Admitting: Orthopaedic Surgery

## 2019-05-18 DIAGNOSIS — Z20822 Contact with and (suspected) exposure to covid-19: Secondary | ICD-10-CM | POA: Insufficient documentation

## 2019-05-18 DIAGNOSIS — Z01812 Encounter for preprocedural laboratory examination: Secondary | ICD-10-CM | POA: Insufficient documentation

## 2019-05-18 LAB — SARS CORONAVIRUS 2 (TAT 6-24 HRS): SARS Coronavirus 2: NEGATIVE

## 2019-05-20 NOTE — H&P (Signed)
TOTAL HIP ADMISSION H&P  Patient is admitted for right total hip arthroplasty.  Subjective:  Chief Complaint: right hip pain  HPI: Michelle Ewing, 44 y.o. female, has a history of pain and functional disability in the right hip(s) due to arthritis and patient has failed non-surgical conservative treatments for greater than 12 weeks to include NSAID's and/or analgesics, corticosteriod injections, flexibility and strengthening excercises and activity modification.  Onset of symptoms was abrupt starting 1 years ago with rapidlly worsening course since that time.The patient noted no past surgery on the right hip(s).  Patient currently rates pain in the right hip at 10 out of 10 with activity. Patient has night pain, worsening of pain with activity and weight bearing, pain that interfers with activities of daily living and pain with passive range of motion. Patient has evidence of subchondral cysts, subchondral sclerosis, periarticular osteophytes and joint space narrowing by imaging studies. This condition presents safety issues increasing the risk of falls.  There is no current active infection.  Patient Active Problem List   Diagnosis Date Noted  . Unilateral primary osteoarthritis, right hip 04/27/2019  . Follow-up examination after orthopedic surgery 11/05/2016  . Aftercare following left hip joint replacement surgery 11/17/2015  . History of total left hip arthroplasty 11/17/2015  . Status post left hip replacement 10/31/2015  . Migraine 10/18/2015  . Primary osteoarthritis of left hip 09/28/2015   Past Medical History:  Diagnosis Date  . Anxiety 2014  . Arthritis   . ASCUS with positive high risk HPV   . Depression   . Exercise-induced asthma   . Migraine with aura   . Smoker     Past Surgical History:  Procedure Laterality Date  . 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 TOTAL HIP ARTHROPLASTY  left    No current facility-administered medications for this encounter.    Current Outpatient Medications  Medication Sig Dispense Refill Last Dose  . Biotin (BIOTIN 5000) 5 MG CAPS Take 5 mg by mouth daily.     . Cholecalciferol (VITAMIN D) 50 MCG (2000 UT) tablet Take 4,000 Units by mouth daily.     . folic acid (FOLVITE) 371 MCG tablet Take 400 mcg by mouth daily.     Marland Kitchen ibuprofen (ADVIL) 200 MG tablet Take 800 mg by mouth daily as needed for headache or moderate pain.     . Melatonin 10 MG CAPS Take 10 mg by mouth at bedtime as needed (sleep).     . Multiple Vitamins-Minerals (AIRBORNE GUMMIES PO) Take 3 tablets by mouth daily.     . Multiple Vitamins-Minerals (MULTIVITAMIN WITH MINERALS) tablet Take 2 tablets by mouth daily.      . Probiotic Product (PROBIOTIC PO) Take 1 capsule by mouth daily.      . valACYclovir (VALTREX) 500 MG tablet Take one tablet bid at onset of outbreak for 3 days (Patient taking differently: Take 500 mg by mouth 2 (two) times daily as needed (outbreak). for 3 days) 30 tablet 12   . DULoxetine (CYMBALTA) 30 MG capsule TAKE 1 CAPSULE BY MOUTH EVERY DAY 90 capsule 2   . metroNIDAZOLE (FLAGYL) 500 MG tablet Take 1 tablet (500 mg total) by mouth 2 (two) times daily. (Patient not taking: Reported on 05/10/2019) 14 tablet 0 Completed Course at Unknown time   Allergies  Allergen Reactions  . Bacitracin     welts  . Peroxide [Hydrogen Peroxide]     welt  . Benzalkonium Chloride Rash  .  Neosporin [Neomycin-Bacitracin Zn-Polymyx] Rash    Social History   Tobacco Use  . Smoking status: Current Some Day Smoker    Types: Cigarettes  . Smokeless tobacco: Never Used  . Tobacco comment: smokes when has a glass of alcohol-rarely  Substance Use Topics  . Alcohol use: Yes    Alcohol/week: 0.0 - 2.0 standard drinks    Family History  Problem Relation Age of Onset  . Migraines Maternal Grandmother   . Heart disease Maternal Grandmother   . Hodgkin's lymphoma Maternal Grandfather      Review of Systems  All other systems reviewed and are  negative.   Objective:  Physical Exam  Constitutional: She is oriented to person, place, and time. She appears well-developed and well-nourished.  HENT:  Head: Normocephalic and atraumatic.  Eyes: Pupils are equal, round, and reactive to light. EOM are normal.  Cardiovascular: Normal rate and regular rhythm.  Respiratory: Effort normal and breath sounds normal.  GI: Soft. Bowel sounds are normal.  Musculoskeletal:     Cervical back: Normal range of motion and neck supple.     Right hip: Tenderness and bony tenderness present. Decreased range of motion. Decreased strength.  Neurological: She is alert and oriented to person, place, and time.  Skin: Skin is warm and dry.  Psychiatric: She has a normal mood and affect.    Vital signs in last 24 hours:    Labs:   Estimated body mass index is 31.33 kg/m as calculated from the following:   Height as of 05/12/19: 5\' 6"  (1.676 m).   Weight as of 05/12/19: 88 kg.   Imaging Review Plain radiographs demonstrate severe degenerative joint disease of the right hip(s). The bone quality appears to be excellent for age and reported activity level.      Assessment/Plan:  End stage arthritis, right hip(s)  The patient history, physical examination, clinical judgement of the provider and imaging studies are consistent with end stage degenerative joint disease of the right hip(s) and total hip arthroplasty is deemed medically necessary. The treatment options including medical management, injection therapy, arthroscopy and arthroplasty were discussed at length. The risks and benefits of total hip arthroplasty were presented and reviewed. The risks due to aseptic loosening, infection, stiffness, dislocation/subluxation,  thromboembolic complications and other imponderables were discussed.  The patient acknowledged the explanation, agreed to proceed with the plan and consent was signed. Patient is being admitted for inpatient treatment for surgery,  pain control, PT, OT, prophylactic antibiotics, VTE prophylaxis, progressive ambulation and ADL's and discharge planning.The patient is planning to be discharged home with home health services

## 2019-05-21 ENCOUNTER — Ambulatory Visit (HOSPITAL_COMMUNITY): Payer: BC Managed Care – PPO

## 2019-05-21 ENCOUNTER — Observation Stay (HOSPITAL_COMMUNITY): Payer: BC Managed Care – PPO

## 2019-05-21 ENCOUNTER — Encounter: Payer: Self-pay | Admitting: Certified Nurse Midwife

## 2019-05-21 ENCOUNTER — Encounter (HOSPITAL_COMMUNITY)
Admission: RE | Disposition: A | Discharge status: Home / Self Care | Payer: Self-pay | Source: Other Acute Inpatient Hospital | Attending: Orthopaedic Surgery

## 2019-05-21 ENCOUNTER — Ambulatory Visit (HOSPITAL_COMMUNITY): Payer: BC Managed Care – PPO | Admitting: Anesthesiology

## 2019-05-21 ENCOUNTER — Encounter (HOSPITAL_COMMUNITY): Payer: Self-pay | Admitting: Orthopaedic Surgery

## 2019-05-21 ENCOUNTER — Other Ambulatory Visit: Payer: Self-pay

## 2019-05-21 ENCOUNTER — Observation Stay (HOSPITAL_COMMUNITY)
Admission: RE | Admit: 2019-05-21 | Discharge: 2019-05-22 | Disposition: A | Discharge status: Home / Self Care | Payer: BC Managed Care – PPO | Source: Other Acute Inpatient Hospital | Attending: Orthopaedic Surgery | Admitting: Orthopaedic Surgery

## 2019-05-21 DIAGNOSIS — Z96642 Presence of left artificial hip joint: Secondary | ICD-10-CM | POA: Insufficient documentation

## 2019-05-21 DIAGNOSIS — Z79899 Other long term (current) drug therapy: Secondary | ICD-10-CM | POA: Diagnosis not present

## 2019-05-21 DIAGNOSIS — Z87891 Personal history of nicotine dependence: Secondary | ICD-10-CM | POA: Insufficient documentation

## 2019-05-21 DIAGNOSIS — M1611 Unilateral primary osteoarthritis, right hip: Secondary | ICD-10-CM | POA: Diagnosis not present

## 2019-05-21 DIAGNOSIS — F329 Major depressive disorder, single episode, unspecified: Secondary | ICD-10-CM | POA: Insufficient documentation

## 2019-05-21 DIAGNOSIS — Z96641 Presence of right artificial hip joint: Secondary | ICD-10-CM

## 2019-05-21 DIAGNOSIS — J45909 Unspecified asthma, uncomplicated: Secondary | ICD-10-CM | POA: Diagnosis not present

## 2019-05-21 DIAGNOSIS — Z471 Aftercare following joint replacement surgery: Secondary | ICD-10-CM | POA: Diagnosis not present

## 2019-05-21 DIAGNOSIS — F418 Other specified anxiety disorders: Secondary | ICD-10-CM | POA: Diagnosis not present

## 2019-05-21 DIAGNOSIS — Z419 Encounter for procedure for purposes other than remedying health state, unspecified: Secondary | ICD-10-CM

## 2019-05-21 HISTORY — PX: TOTAL HIP ARTHROPLASTY: SHX124

## 2019-05-21 LAB — PREGNANCY, URINE: Preg Test, Ur: NEGATIVE

## 2019-05-21 SURGERY — ARTHROPLASTY, HIP, TOTAL, ANTERIOR APPROACH
Anesthesia: Spinal | Site: Hip | Laterality: Right

## 2019-05-21 MED ORDER — ONDANSETRON HCL 4 MG/2ML IJ SOLN: 4.0000 mg | Freq: Once | INTRAMUSCULAR | Status: DC | PRN

## 2019-05-21 MED ORDER — ACETAMINOPHEN 325 MG PO TABS: 325.0000 mg | ORAL_TABLET | Freq: Four times a day (QID) | ORAL | Status: DC | PRN

## 2019-05-21 MED ORDER — FENTANYL CITRATE (PF) 100 MCG/2ML IJ SOLN
INTRAMUSCULAR | Status: AC
  Filled 2019-05-21: qty 2

## 2019-05-21 MED ORDER — HYDROMORPHONE HCL 1 MG/ML IJ SOLN
0.5000 mg | INTRAMUSCULAR | Status: DC | PRN
  Administered 2019-05-21: 1 mg via INTRAVENOUS
  Filled 2019-05-21: qty 1

## 2019-05-21 MED ORDER — ONDANSETRON HCL 4 MG/2ML IJ SOLN: 4.0000 mg | Freq: Four times a day (QID) | INTRAMUSCULAR | Status: DC | PRN

## 2019-05-21 MED ORDER — OXYCODONE HCL 5 MG/5ML PO SOLN: 5.0000 mg | Freq: Once | ORAL | Status: DC | PRN

## 2019-05-21 MED ORDER — FENTANYL CITRATE (PF) 100 MCG/2ML IJ SOLN: 25.0000 ug | INTRAMUSCULAR | Status: DC | PRN

## 2019-05-21 MED ORDER — ONDANSETRON HCL 4 MG/2ML IJ SOLN
INTRAMUSCULAR | Status: DC | PRN
  Administered 2019-05-21: 4 mg via INTRAVENOUS

## 2019-05-21 MED ORDER — DIPHENHYDRAMINE HCL 12.5 MG/5ML PO ELIX: 12.5000 mg | ORAL_SOLUTION | ORAL | Status: DC | PRN

## 2019-05-21 MED ORDER — OXYCODONE HCL 5 MG PO TABS
5.0000 mg | ORAL_TABLET | ORAL | Status: DC | PRN
  Administered 2019-05-21: 10 mg via ORAL
  Administered 2019-05-21: 5 mg via ORAL
  Filled 2019-05-21: qty 1
  Filled 2019-05-21: qty 2

## 2019-05-21 MED ORDER — POLYETHYLENE GLYCOL 3350 17 G PO PACK: 17.0000 g | PACK | Freq: Every day | ORAL | Status: DC | PRN

## 2019-05-21 MED ORDER — ZOLPIDEM TARTRATE 5 MG PO TABS
5.0000 mg | ORAL_TABLET | Freq: Every evening | ORAL | Status: DC | PRN
  Administered 2019-05-21: 5 mg via ORAL
  Filled 2019-05-21: qty 1

## 2019-05-21 MED ORDER — MIDAZOLAM HCL 2 MG/2ML IJ SOLN
INTRAMUSCULAR | Status: AC
  Filled 2019-05-21: qty 2

## 2019-05-21 MED ORDER — METOCLOPRAMIDE HCL 5 MG PO TABS: 5.0000 mg | ORAL_TABLET | Freq: Three times a day (TID) | ORAL | Status: DC | PRN

## 2019-05-21 MED ORDER — POVIDONE-IODINE 10 % EX SWAB
2.0000 "application " | Freq: Once | CUTANEOUS | Status: AC
  Administered 2019-05-21: 2 via TOPICAL

## 2019-05-21 MED ORDER — PROPOFOL 1000 MG/100ML IV EMUL
INTRAVENOUS | Status: AC
  Filled 2019-05-21: qty 100

## 2019-05-21 MED ORDER — KETOROLAC TROMETHAMINE 15 MG/ML IJ SOLN
7.5000 mg | Freq: Four times a day (QID) | INTRAMUSCULAR | Status: AC
  Administered 2019-05-21 – 2019-05-22 (×3): 7.5 mg via INTRAVENOUS
  Filled 2019-05-21 (×3): qty 1

## 2019-05-21 MED ORDER — FENTANYL CITRATE (PF) 100 MCG/2ML IJ SOLN
INTRAMUSCULAR | Status: DC | PRN
  Administered 2019-05-21: 100 ug via INTRAVENOUS

## 2019-05-21 MED ORDER — ASPIRIN 81 MG PO CHEW
81.0000 mg | CHEWABLE_TABLET | Freq: Two times a day (BID) | ORAL | Status: DC
  Administered 2019-05-21 – 2019-05-22 (×2): 81 mg via ORAL
  Filled 2019-05-21 (×2): qty 1

## 2019-05-21 MED ORDER — CEFAZOLIN SODIUM-DEXTROSE 2-4 GM/100ML-% IV SOLN
2.0000 g | INTRAVENOUS | Status: AC
  Administered 2019-05-21: 2 g via INTRAVENOUS
  Filled 2019-05-21: qty 100

## 2019-05-21 MED ORDER — METOCLOPRAMIDE HCL 5 MG/ML IJ SOLN: 5.0000 mg | Freq: Three times a day (TID) | INTRAMUSCULAR | Status: DC | PRN

## 2019-05-21 MED ORDER — ADULT MULTIVITAMIN W/MINERALS CH
2.0000 | ORAL_TABLET | Freq: Every day | ORAL | Status: DC
  Administered 2019-05-21 – 2019-05-22 (×2): 2 via ORAL
  Filled 2019-05-21 (×2): qty 2

## 2019-05-21 MED ORDER — CHLORHEXIDINE GLUCONATE 4 % EX LIQD: 60.0000 mL | Freq: Once | CUTANEOUS | Status: DC

## 2019-05-21 MED ORDER — PROPOFOL 500 MG/50ML IV EMUL
INTRAVENOUS | Status: DC | PRN
  Administered 2019-05-21: 125 ug/kg/min via INTRAVENOUS

## 2019-05-21 MED ORDER — DOCUSATE SODIUM 100 MG PO CAPS
100.0000 mg | ORAL_CAPSULE | Freq: Two times a day (BID) | ORAL | Status: DC
  Administered 2019-05-21 – 2019-05-22 (×2): 100 mg via ORAL
  Filled 2019-05-21 (×2): qty 1

## 2019-05-21 MED ORDER — METHOCARBAMOL 500 MG IVPB - SIMPLE MED
500.0000 mg | Freq: Four times a day (QID) | INTRAVENOUS | Status: DC | PRN
  Administered 2019-05-21: 500 mg via INTRAVENOUS
  Filled 2019-05-21: qty 50
  Filled 2019-05-21: qty 500

## 2019-05-21 MED ORDER — SODIUM CHLORIDE 0.9 % IV SOLN: INTRAVENOUS | Status: DC

## 2019-05-21 MED ORDER — PROPOFOL 10 MG/ML IV BOLUS
INTRAVENOUS | Status: DC | PRN
  Administered 2019-05-21: 30 mg via INTRAVENOUS

## 2019-05-21 MED ORDER — BIOTIN 5 MG PO CAPS: 5.0000 mg | ORAL_CAPSULE | Freq: Every day | ORAL | Status: DC

## 2019-05-21 MED ORDER — FOLIC ACID 0.5 MG HALF TAB
500.0000 ug | ORAL_TABLET | Freq: Every day | ORAL | Status: DC
  Administered 2019-05-22: 0.5 mg via ORAL
  Filled 2019-05-21 (×2): qty 1

## 2019-05-21 MED ORDER — ALUM & MAG HYDROXIDE-SIMETH 200-200-20 MG/5ML PO SUSP: 30.0000 mL | ORAL | Status: DC | PRN

## 2019-05-21 MED ORDER — CEFAZOLIN SODIUM-DEXTROSE 1-4 GM/50ML-% IV SOLN
1.0000 g | Freq: Four times a day (QID) | INTRAVENOUS | Status: AC
  Administered 2019-05-21 (×2): 1 g via INTRAVENOUS
  Filled 2019-05-21 (×2): qty 50

## 2019-05-21 MED ORDER — OXYCODONE HCL 5 MG PO TABS
10.0000 mg | ORAL_TABLET | ORAL | Status: DC | PRN
  Administered 2019-05-21 – 2019-05-22 (×5): 15 mg via ORAL
  Filled 2019-05-21 (×5): qty 3

## 2019-05-21 MED ORDER — VITAMIN D 25 MCG (1000 UNIT) PO TABS
4000.0000 [IU] | ORAL_TABLET | Freq: Every day | ORAL | Status: DC
  Administered 2019-05-21 – 2019-05-22 (×2): 4000 [IU] via ORAL
  Filled 2019-05-21 (×2): qty 4

## 2019-05-21 MED ORDER — PHENOL 1.4 % MT LIQD
1.0000 | OROMUCOSAL | Status: DC | PRN
  Filled 2019-05-21: qty 177

## 2019-05-21 MED ORDER — CHLORHEXIDINE GLUCONATE CLOTH 2 % EX PADS
6.0000 | MEDICATED_PAD | Freq: Every day | CUTANEOUS | Status: DC
  Administered 2019-05-22: 11:00:00 6 via TOPICAL

## 2019-05-21 MED ORDER — METHOCARBAMOL 500 MG PO TABS
500.0000 mg | ORAL_TABLET | Freq: Four times a day (QID) | ORAL | Status: DC | PRN
  Administered 2019-05-21 – 2019-05-22 (×2): 500 mg via ORAL
  Filled 2019-05-21 (×2): qty 1

## 2019-05-21 MED ORDER — PANTOPRAZOLE SODIUM 40 MG PO TBEC
40.0000 mg | DELAYED_RELEASE_TABLET | Freq: Every day | ORAL | Status: DC
  Administered 2019-05-21 – 2019-05-22 (×2): 40 mg via ORAL
  Filled 2019-05-21 (×2): qty 1

## 2019-05-21 MED ORDER — GABAPENTIN 100 MG PO CAPS
100.0000 mg | ORAL_CAPSULE | Freq: Three times a day (TID) | ORAL | Status: DC
  Administered 2019-05-21 – 2019-05-22 (×3): 100 mg via ORAL
  Filled 2019-05-21 (×3): qty 1

## 2019-05-21 MED ORDER — HYDROMORPHONE HCL 1 MG/ML IJ SOLN
0.5000 mg | INTRAMUSCULAR | Status: DC | PRN
  Administered 2019-05-21 (×3): 1 mg via INTRAVENOUS
  Filled 2019-05-21 (×3): qty 1

## 2019-05-21 MED ORDER — ONDANSETRON HCL 4 MG PO TABS: 4.0000 mg | ORAL_TABLET | Freq: Four times a day (QID) | ORAL | Status: DC | PRN

## 2019-05-21 MED ORDER — MENTHOL 3 MG MT LOZG: 1.0000 | LOZENGE | OROMUCOSAL | Status: DC | PRN

## 2019-05-21 MED ORDER — LACTATED RINGERS IV SOLN: INTRAVENOUS | Status: DC

## 2019-05-21 MED ORDER — PROPOFOL 10 MG/ML IV BOLUS
INTRAVENOUS | Status: AC
  Filled 2019-05-21: qty 20

## 2019-05-21 MED ORDER — OXYCODONE HCL 5 MG PO TABS: 5.0000 mg | ORAL_TABLET | Freq: Once | ORAL | Status: DC | PRN

## 2019-05-21 MED ORDER — 0.9 % SODIUM CHLORIDE (POUR BTL) OPTIME
TOPICAL | Status: DC | PRN
  Administered 2019-05-21: 1000 mL

## 2019-05-21 MED ORDER — TRANEXAMIC ACID-NACL 1000-0.7 MG/100ML-% IV SOLN
1000.0000 mg | INTRAVENOUS | Status: AC
  Administered 2019-05-21: 1000 mg via INTRAVENOUS
  Filled 2019-05-21: qty 100

## 2019-05-21 MED ORDER — SODIUM CHLORIDE 0.9 % IR SOLN
Status: DC | PRN
  Administered 2019-05-21: 1000 mL

## 2019-05-21 MED ORDER — BUPIVACAINE IN DEXTROSE 0.75-8.25 % IT SOLN
INTRATHECAL | Status: DC | PRN
  Administered 2019-05-21: 1.8 mL via INTRATHECAL

## 2019-05-21 MED ORDER — STERILE WATER FOR IRRIGATION IR SOLN
Status: DC | PRN
  Administered 2019-05-21: 2000 mL

## 2019-05-21 MED ORDER — DULOXETINE HCL 30 MG PO CPEP
30.0000 mg | ORAL_CAPSULE | Freq: Every day | ORAL | Status: DC
  Administered 2019-05-22: 30 mg via ORAL
  Filled 2019-05-21: qty 1

## 2019-05-21 MED ORDER — DEXAMETHASONE SODIUM PHOSPHATE 10 MG/ML IJ SOLN
INTRAMUSCULAR | Status: DC | PRN
  Administered 2019-05-21: 10 mg via INTRAVENOUS

## 2019-05-21 MED ORDER — MIDAZOLAM HCL 2 MG/2ML IJ SOLN
INTRAMUSCULAR | Status: DC | PRN
  Administered 2019-05-21: 1 mg via INTRAVENOUS
  Administered 2019-05-21: 2 mg via INTRAVENOUS
  Administered 2019-05-21: 1 mg via INTRAVENOUS

## 2019-05-21 SURGICAL SUPPLY — 42 items
BAG ZIPLOCK 12X15 (MISCELLANEOUS) IMPLANT
BENZOIN TINCTURE PRP APPL 2/3 (GAUZE/BANDAGES/DRESSINGS) ×2 IMPLANT
BLADE SAW SGTL 18X1.27X75 (BLADE) ×2 IMPLANT
CLSR STERI-STRIP ANTIMIC 1/2X4 (GAUZE/BANDAGES/DRESSINGS) ×1 IMPLANT
COVER PERINEAL POST (MISCELLANEOUS) ×2 IMPLANT
COVER SURGICAL LIGHT HANDLE (MISCELLANEOUS) ×2 IMPLANT
COVER WAND RF STERILE (DRAPES) ×2 IMPLANT
CUP SECTOR GRIPTON 50MM (Cup) ×1 IMPLANT
DRAPE STERI IOBAN 125X83 (DRAPES) ×2 IMPLANT
DRAPE U-SHAPE 47X51 STRL (DRAPES) ×4 IMPLANT
DRESSING AQUACEL AG SP 3.5X10 (GAUZE/BANDAGES/DRESSINGS) IMPLANT
DRSG AQUACEL AG ADV 3.5X10 (GAUZE/BANDAGES/DRESSINGS) ×2 IMPLANT
DRSG AQUACEL AG SP 3.5X10 (GAUZE/BANDAGES/DRESSINGS) ×2
DURAPREP 26ML APPLICATOR (WOUND CARE) ×2 IMPLANT
ELECT REM PT RETURN 15FT ADLT (MISCELLANEOUS) ×2 IMPLANT
GAUZE XEROFORM 1X8 LF (GAUZE/BANDAGES/DRESSINGS) ×2 IMPLANT
GLOVE BIO SURGEON STRL SZ7.5 (GLOVE) ×2 IMPLANT
GLOVE BIOGEL PI IND STRL 8 (GLOVE) ×2 IMPLANT
GLOVE BIOGEL PI INDICATOR 8 (GLOVE) ×2
GLOVE ECLIPSE 8.0 STRL XLNG CF (GLOVE) ×2 IMPLANT
GOWN STRL REUS W/TWL XL LVL3 (GOWN DISPOSABLE) ×4 IMPLANT
HANDPIECE INTERPULSE COAX TIP (DISPOSABLE) ×1
HEAD FEMORAL 32 CERAMIC (Hips) ×1 IMPLANT
HOLDER FOLEY CATH W/STRAP (MISCELLANEOUS) ×2 IMPLANT
KIT TURNOVER KIT A (KITS) IMPLANT
LINER ACETABULAR 32X50 (Liner) ×1 IMPLANT
PACK ANTERIOR HIP CUSTOM (KITS) ×2 IMPLANT
PENCIL SMOKE EVACUATOR (MISCELLANEOUS) ×1 IMPLANT
SET HNDPC FAN SPRY TIP SCT (DISPOSABLE) ×1 IMPLANT
STAPLER VISISTAT 35W (STAPLE) IMPLANT
STEM CORAIL KA11 (Stem) ×1 IMPLANT
STRIP CLOSURE SKIN 1/2X4 (GAUZE/BANDAGES/DRESSINGS) ×1 IMPLANT
SUT ETHIBOND NAB CT1 #1 30IN (SUTURE) ×2 IMPLANT
SUT ETHILON 2 0 PS N (SUTURE) IMPLANT
SUT MNCRL AB 4-0 PS2 18 (SUTURE) ×1 IMPLANT
SUT VIC AB 0 CT1 36 (SUTURE) ×2 IMPLANT
SUT VIC AB 1 CT1 36 (SUTURE) ×2 IMPLANT
SUT VIC AB 2-0 CT1 27 (SUTURE) ×2
SUT VIC AB 2-0 CT1 TAPERPNT 27 (SUTURE) ×2 IMPLANT
TRAY FOLEY MTR SLVR 14FR STAT (SET/KITS/TRAYS/PACK) ×1 IMPLANT
TRAY FOLEY MTR SLVR 16FR STAT (SET/KITS/TRAYS/PACK) IMPLANT
YANKAUER SUCT BULB TIP 10FT TU (MISCELLANEOUS) ×2 IMPLANT

## 2019-05-21 NOTE — Brief Op Note (Signed)
05/21/2019  9:39 AM  PATIENT:  Michelle Ewing  44 y.o. female  PRE-OPERATIVE DIAGNOSIS:  osteoarthritis right hip  POST-OPERATIVE DIAGNOSIS:  osteoarthritis right hip  PROCEDURE:  Procedure(s): RIGHT TOTAL HIP ARTHROPLASTY ANTERIOR APPROACH (Right)  SURGEON:  Surgeon(s) and Role:    Kathryne Hitch, MD - Primary  PHYSICIAN ASSISTANT: Rexene Edison, PA-C  ANESTHESIA:   spinal  EBL: 350 cc  COUNTS:  YES  DICTATION: .Other Dictation: Dictation Number [id number]  PLAN OF CARE: Admit for overnight observation  PATIENT DISPOSITION:  PACU - hemodynamically stable.   Delay start of Pharmacological VTE agent (>24hrs) due to surgical blood loss or risk of bleeding: no

## 2019-05-21 NOTE — Evaluation (Signed)
Physical Therapy Evaluation Patient Details Name: Michelle Ewing MRN: [id number] DOB: 05-Apr-1975 Today's Date: 05/21/2019   History of Present Illness  s/p  R DA THA. PMH: L DA THA  Clinical Impression  Pt is s/p THA resulting in the deficits listed below (see PT Problem List).  PT amb 50' with RW and min assist. Distance limited by PT, discussed inflammatory response and no over doing it early post op. Anticipate steady progress in acute setting   Pt will benefit from skilled PT to increase their independence and safety with mobility to allow discharge to the venue listed below.      Follow Up Recommendations Follow surgeon's recommendation for DC plan and follow-up therapies(pt would like f/u PT)    Equipment Recommendations  Rolling walker with 5" wheels    Recommendations for Other Services       Precautions / Restrictions Precautions Precautions: Fall Restrictions Weight Bearing Restrictions: No Other Position/Activity Restrictions: WBAT      Mobility  Bed Mobility Overal bed mobility: Needs Assistance Bed Mobility: Supine to Sit     Supine to sit: Min assist     General bed mobility comments: assist with RLE  Transfers Overall transfer level: Needs assistance Equipment used: Rolling walker (2 wheeled) Transfers: Sit to/from Stand Sit to Stand: Min assist         General transfer comment: cues for hand placement, incr time  Ambulation/Gait Ambulation/Gait assistance: Min assist Gait Distance (Feet): 50 Feet Assistive device: Rolling walker (2 wheeled) Gait Pattern/deviations: Step-to pattern;Decreased stance time - right;Decreased weight shift to right;Decreased step length - right;Decreased step length - left Gait velocity: decr   General Gait Details: cues for sequence and use of UEs for decr WBing/pain control RLE  Stairs            Wheelchair Mobility    Modified Rankin (Stroke Patients Only)       Balance                                              Pertinent Vitals/Pain Pain Assessment: 0-10 Pain Score: 3  Pain Location: R hip Pain Descriptors / Indicators: Grimacing;Aching;Sore Pain Intervention(s): Limited activity within patient's tolerance;Monitored during session;Premedicated before session;Repositioned    Home Living Family/patient expects to be discharged to:: Private residence Living Arrangements: Children Available Help at Discharge: Family Type of Home: House Home Access: Stairs to enter   Technical brewer of Steps: 2-3 Home Layout: One level Home Equipment: Other (comment) Additional Comments: goign to stay with friends, has "hiking sticks"    Prior Function Level of Independence: Independent               Hand Dominance        Extremity/Trunk Assessment   Upper Extremity Assessment Upper Extremity Assessment: Overall WFL for tasks assessed    Lower Extremity Assessment Lower Extremity Assessment: RLE deficits/detail RLE Deficits / Details: ankle WFL, knee and hip grossly 2+ to 3/5, limited by post op pain and weakness       Communication   Communication: No difficulties  Cognition Arousal/Alertness: Awake/alert Behavior During Therapy: WFL for tasks assessed/performed Overall Cognitive Status: Within Functional Limits for tasks assessed  General Comments      Exercises Total Joint Exercises Ankle Circles/Pumps: AROM;Both;10 reps Quad Sets: AROM;Both;5 reps   Assessment/Plan    PT Assessment Patient needs continued PT services  PT Problem List Decreased strength;Decreased range of motion;Decreased activity tolerance;Decreased knowledge of use of DME;Pain;Decreased mobility       PT Treatment Interventions DME instruction;Therapeutic exercise;Gait training;Functional mobility training;Therapeutic activities;Patient/family education;Stair training    PT Goals (Current goals can be found  in the Care Plan section)  Acute Rehab PT Goals Patient Stated Goal: to start hiking again PT Goal Formulation: With patient Time For Goal Achievement: 05/28/19 Potential to Achieve Goals: Good    Frequency 7X/week   Barriers to discharge        Co-evaluation               AM-PAC PT "6 Clicks" Mobility  Outcome Measure Help needed turning from your back to your side while in a flat bed without using bedrails?: A Little Help needed moving from lying on your back to sitting on the side of a flat bed without using bedrails?: A Little Help needed moving to and from a bed to a chair (including a wheelchair)?: A Little Help needed standing up from a chair using your arms (e.g., wheelchair or bedside chair)?: A Little Help needed to walk in hospital room?: A Little Help needed climbing 3-5 steps with a railing? : A Little 6 Click Score: 18    End of Session Equipment Utilized During Treatment: Gait belt Activity Tolerance: Patient tolerated treatment well Patient left: in chair;with call bell/phone within reach;with chair alarm set;with family/visitor present Nurse Communication: Mobility status PT Visit Diagnosis: Difficulty in walking, not elsewhere classified (R26.2)    Time: 4503-8882 PT Time Calculation (min) (ACUTE ONLY): 24 min   Charges:   PT Evaluation $PT Eval Low Complexity: 1 Low PT Treatments $Gait Training: 8-22 mins        Delice Bison, PT   Acute Rehab Dept [facility]): [phone]   05/21/2019   [facility] 05/21/2019, 4:01 PM

## 2019-05-21 NOTE — Plan of Care (Signed)

## 2019-05-21 NOTE — Anesthesia Preprocedure Evaluation (Signed)
Anesthesia Evaluation  Patient identified by MRN, date of birth, ID band Patient awake    Reviewed: Allergy & Precautions, NPO status , Patient's Chart, lab work & pertinent test results  History of Anesthesia Complications Negative for: history of anesthetic complications  Airway Mallampati: II  TM Distance: >3 FB Neck ROM: Full    Dental  (+) Teeth Intact   Pulmonary asthma , Current Smoker,    Pulmonary exam normal        Cardiovascular negative cardio ROS Normal cardiovascular exam     Neuro/Psych  Headaches, PSYCHIATRIC DISORDERS Anxiety Depression    GI/Hepatic negative GI ROS, Neg liver ROS,   Endo/Other  negative endocrine ROS  Renal/GU negative Renal ROS  negative genitourinary   Musculoskeletal negative musculoskeletal ROS (+)   Abdominal   Peds  Hematology negative hematology ROS (+)   Anesthesia Other Findings   Reproductive/Obstetrics                            Anesthesia Physical Anesthesia Plan  ASA: II  Anesthesia Plan: Spinal   Post-op Pain Management:    Induction:   PONV Risk Score and Plan: 2 and Propofol infusion, Treatment may vary due to age or medical condition, Ondansetron and TIVA  Airway Management Planned: Nasal Cannula and Simple Face Mask  Additional Equipment: None  Intra-op Plan:   Post-operative Plan:   Informed Consent: I have reviewed the patients History and Physical, chart, labs and discussed the procedure including the risks, benefits and alternatives for the proposed anesthesia with the patient or authorized representative who has indicated his/her understanding and acceptance.       Plan Discussed with:   Anesthesia Plan Comments:         Anesthesia Quick Evaluation

## 2019-05-21 NOTE — Anesthesia Postprocedure Evaluation (Signed)
Anesthesia Post Note  Patient: Michelle Ewing  Procedure(s) Performed: RIGHT TOTAL HIP ARTHROPLASTY ANTERIOR APPROACH (Right Hip)     Patient location during evaluation: PACU Anesthesia Type: Spinal Level of consciousness: oriented and awake and alert Pain management: pain level controlled Vital Signs Assessment: post-procedure vital signs reviewed and stable Respiratory status: spontaneous breathing, respiratory function stable and nonlabored ventilation Cardiovascular status: blood pressure returned to baseline and stable Postop Assessment: no headache, no backache, no apparent nausea or vomiting and spinal receding Anesthetic complications: no    Last Vitals:  Vitals:   05/21/19 1341 05/21/19 1438  BP: (!) 141/91 (!) 151/83  Pulse: 84 85  Resp: 16 16  Temp: (!) 36.3 C   SpO2: 100% 98%    Last Pain:  Vitals:   05/21/19 1512  TempSrc:   PainSc: 3                  Lucretia Kern

## 2019-05-21 NOTE — Anesthesia Procedure Notes (Signed)
Spinal  Patient location during procedure: OR Start time: 05/21/2019 8:28 AM Staffing Performed: resident/CRNA  Resident/CRNA: [place] Victor Langenbach C, CRNA Preanesthetic Checklist Completed: patient identified, IV checked, site marked, risks and benefits discussed, surgical consent, monitors and equipment checked, pre-op evaluation and timeout performed Spinal Block Patient position: sitting Prep: DuraPrep and site prepped and draped Patient monitoring: heart rate, cardiac monitor, continuous pulse ox and blood pressure Approach: midline Location: L3-4 Injection technique: single-shot Needle Needle type: Pencan  Needle gauge: 24 G Needle length: 9 cm Assessment Sensory level: T4 Additional Notes IV functioning, monitors applied to pt. Expiration date of kit checked and confirmed to be in date. Sterile prep and drape, hand hygiene and sterile gloved used. Pt was positioned and spine was prepped in sterile fashion. Skin was anesthetized with lidocaine. Free flow of clear CSF obtained prior to injecting local anesthetic into CSF x 1 attempt. Spinal needle aspirated freely following injection. Needle was carefully withdrawn, and pt tolerated procedure well. Loss of motor and sensory on exam post injection.

## 2019-05-21 NOTE — Op Note (Signed)
Michelle Ewing, Michelle Ewing MEDICAL RECORD QI:[id number] ACCOUNT [id number] DATE OF BIRTH:Dec 07, 1975 FACILITY: [facility] LOCATION: [facility] PHYSICIAN:Raford Brissett Aretha Parrot, MD  OPERATIVE REPORT  DATE OF PROCEDURE:  05/21/2019  PREOPERATIVE DIAGNOSIS:  Primary osteoarthritis and degenerative joint disease, right hip.  POSTOPERATIVE DIAGNOSIS:  Primary osteoarthritis and degenerative joint disease, right hip.  PROCEDURE:  Right total hip arthroplasty through direct anterior approach.  IMPLANTS:  DePuy Sector Gription acetabular component size 50, size 32+0 neutral polyethylene liner, size 11 Corail femoral component with standard offset, size 32+1 ceramic hip ball.  SURGEON:  Vanita Panda.  Magnus Ivan, MD  ASSISTANT:  Richardean Canal, PA-C  ANESTHESIA:  Spinal.  ANTIBIOTICS:  Two g IV Ancef.  ESTIMATED BLOOD LOSS:  350 mL.  COMPLICATIONS:  None.  INDICATIONS:  The patient is a 44 year old female who has surprisingly debilitating arthritis involving both her hips.  She actually underwent a left total hip arthroplasty at [facility] a few years ago.  Her right hip over this last  year has been hurting significantly.  Her x-rays do not show significant wear in her hip, but internal and external rotation was causing severe pain, so we did obtain an MRI of her right hip.  This was an MR arthrogram as well.  It did show significant  degenerative changes of the femoral head and acetabulum as well as a large degenerative labral tear.  At this point, with her discomfort in her right hip, she does wish to proceed with a total hip arthroplasty on the right side, having had this on the  left side and done so well.  She understands the risk of acute blood loss anemia, nerve or vessel injury, fracture, infection, dislocation, DVT and implant failure.  She understands our goals are to decrease pain, improve mobility and overall improve  quality of life.  DESCRIPTION OF PROCEDURE:  After  informed consent was obtained, the appropriate right hip was marked.  She was brought to the operating room where spinal anesthesia was obtained while she was on a stretcher.  Foley catheter was placed.  I was able to  assess her leg lengths and found her to be just slightly shorter on her right operative side than the left side.  Traction boots were placed on both her feet and she was placed supine on the Hana fracture table with the perineal post in place and both  legs in line skeletal traction device and no traction applied.  Her right operative hip was prepped and draped with DuraPrep and sterile drapes.  A time-out was called.  She was identified as correct patient, correct right hip.  I then made an incision  just inferior and posterior to the anterior superior iliac spine and carried this obliquely down the leg.  We dissected down to the tensor fascia lata muscle.  Tensor fascia was then divided longitudinally to proceed with direct anterior approach to the  hip.  We identified and cauterized circumflex vessels and identified the hip capsule, opened up the hip capsule in an L-type format, finding a moderate joint effusion.  I placed Cobra retractors within the joint capsule around the medial and lateral  femoral neck and made our femoral neck cut with an oscillating saw and completed this with an osteotome.  We placed a corkscrew guide in the femoral head and removed the femoral head in its entirety and found surprisingly a wide area devoid of cartilage.   This correlated with the MRI findings, but definitely looks worse.  I then placed  a bent Hohmann over the medial acetabular rim and removed remnants of the acetabular labrum and other debris.  I then began reaming under direct visualization from a size  43 reamer in stepwise increments, going up to a size 49 With a size 49 in place, we assessed this radiographically as well.  The last reamer was placed under direct visualization and direct  fluoroscopy, so we could obtain our depth in reaming, our  inclination and anteversion.  I then placed the real DePuy Sector Gription acetabular component size 50 and we went with the 32+0 neutral polyethylene liner.  Attention was then turned to the femur.  With the leg externally rotated to 120 degrees,  extended and adducted, we were able to place a Mueller retractor medially and a Hohman retractor behind the greater trochanter.  We released the lateral joint capsule and used a box-cutting osteotome to enter the femoral canal and a rongeur to  lateralize.  We then began broaching using the Corail broaching system from a size 8 up to a size 11.  With a size 11 in place, we trialed a standard offset femoral neck and a 32+1 hip ball, reduced this in the acetabulum and I was pleased with leg  length, offset, range of motion and stability assessed mechanically and radiographically.  We then dislocated the hip and removed the trial components.  I placed the real Corail femoral component with standard offset size 11 and went with a real 32+1  metal hip ball, again reduced this in the acetabulum and it was stable.  We then irrigated the soft tissue with normal saline solution using pulsatile lavage.  We closed the joint capsule with interrupted #1 Ethibond suture, followed by closing the  tensor fascia with #1 Vicryl.  The deep tissue was closed with 0 Vicryl, while   the subcutaneous tissue was closed with 2-0 Vicryl.  A 4-0 Monocryl subcuticular stitch was then placed as well as Steri-Strips on the skin.  An Aquacel dressing was put on.   The patient was taken off the Hana table and taken to the recovery room in stable condition.  All final counts were correct.  There were no complications noted.  Of note, Benita Stabile, PA-C, assisted during the entire case and his assistance was crucial  for facilitating all aspects of this case.  VN/NUANCE  D:05/21/2019 T:05/21/2019 JOB:[id number]

## 2019-05-21 NOTE — Transfer of Care (Signed)
Immediate Anesthesia Transfer of Care Note  Patient: Michelle Ewing  Procedure(s) Performed: RIGHT TOTAL HIP ARTHROPLASTY ANTERIOR APPROACH (Right Hip)  Patient Location: PACU  Anesthesia Type:Spinal  Level of Consciousness: awake and drowsy  Airway & Oxygen Therapy: Patient Spontanous Breathing and Patient connected to face mask oxygen  Post-op Assessment: Report given to RN and Post -op Vital signs reviewed and stable  Post vital signs: Reviewed and stable  Last Vitals:  Vitals Value Taken Time  BP    Temp    Pulse 79 05/21/19 1003  Resp 13 05/21/19 1003  SpO2 99 % 05/21/19 1003  Vitals shown include unvalidated device data.  Last Pain:  Vitals:   05/21/19 0622  TempSrc:   PainSc: 0-No pain         Complications: No apparent anesthesia complications

## 2019-05-21 NOTE — H&P (Signed)
The patient understands fully that we are proceeding to the surgery today for a right total hip arthroplasty.  There has been no acute change in her medical status.  See previous H&P.  The risk and benefits of surgery have been discussed in detail at length and informed consent is obtained.  The right hip has been marked.

## 2019-05-22 DIAGNOSIS — F329 Major depressive disorder, single episode, unspecified: Secondary | ICD-10-CM | POA: Diagnosis not present

## 2019-05-22 DIAGNOSIS — Z96642 Presence of left artificial hip joint: Secondary | ICD-10-CM | POA: Diagnosis not present

## 2019-05-22 DIAGNOSIS — Z79899 Other long term (current) drug therapy: Secondary | ICD-10-CM | POA: Diagnosis not present

## 2019-05-22 DIAGNOSIS — Z87891 Personal history of nicotine dependence: Secondary | ICD-10-CM | POA: Diagnosis not present

## 2019-05-22 DIAGNOSIS — M6281 Muscle weakness (generalized): Secondary | ICD-10-CM | POA: Diagnosis not present

## 2019-05-22 DIAGNOSIS — M1611 Unilateral primary osteoarthritis, right hip: Secondary | ICD-10-CM | POA: Diagnosis not present

## 2019-05-22 LAB — CBC
HCT: 31.7 % — ABNORMAL LOW (ref 36.0–46.0)
Hemoglobin: 10.5 g/dL — ABNORMAL LOW (ref 12.0–15.0)
MCH: 30.4 pg (ref 26.0–34.0)
MCHC: 33.1 g/dL (ref 30.0–36.0)
MCV: 91.9 fL (ref 80.0–100.0)
Platelets: 212 10*3/uL (ref 150–400)
RBC: 3.45 MIL/uL — ABNORMAL LOW (ref 3.87–5.11)
RDW: 13.4 % (ref 11.5–15.5)
WBC: 12.5 10*3/uL — ABNORMAL HIGH (ref 4.0–10.5)
nRBC: 0 % (ref 0.0–0.2)

## 2019-05-22 LAB — BASIC METABOLIC PANEL
Anion gap: 7 (ref 5–15)
BUN: 10 mg/dL (ref 6–20)
CO2: 25 mmol/L (ref 22–32)
Calcium: 8.9 mg/dL (ref 8.9–10.3)
Chloride: 104 mmol/L (ref 98–111)
Creatinine, Ser: 0.81 mg/dL (ref 0.44–1.00)
GFR calc Af Amer: >60 mL/min (ref >60)
GFR calc non Af Amer: >60 mL/min (ref >60)
Glucose, Bld: 154 mg/dL — ABNORMAL HIGH (ref 70–99)
Potassium: 4.2 mmol/L (ref 3.5–5.1)
Sodium: 136 mmol/L (ref 135–145)

## 2019-05-22 MED ORDER — METHOCARBAMOL 500 MG PO TABS: 500.0000 mg | ORAL_TABLET | Freq: Four times a day (QID) | ORAL | 0 refills | Status: DC | PRN

## 2019-05-22 MED ORDER — OXYCODONE HCL 5 MG PO TABS: 5.0000 mg | ORAL_TABLET | ORAL | 0 refills | Status: DC | PRN

## 2019-05-22 MED ORDER — ASPIRIN 81 MG PO CHEW: 81.0000 mg | CHEWABLE_TABLET | Freq: Two times a day (BID) | ORAL | 0 refills | Status: DC

## 2019-05-22 NOTE — Progress Notes (Signed)
Subjective: 1 Day Post-Op Procedure(s) (LRB): RIGHT TOTAL HIP ARTHROPLASTY ANTERIOR APPROACH (Right) Patient reports pain as moderate.    Objective: Vital signs in last 24 hours: Temp:  [97.4 F (36.3 C)-98.9 F (37.2 C)] 98.2 F (36.8 C) (03/20 0952) Pulse Rate:  [62-85] 82 (03/20 0952) Resp:  [12-18] 18 (03/20 0952) BP: (104-151)/(64-91) 121/80 (03/20 0952) SpO2:  [95 %-100 %] 95 % (03/20 0952)  Intake/Output from previous day: 03/19 0701 - 03/20 0700 In: 2770 [P.O.:720; I.V.:1800; IV Piggyback:250] Out: 2175 [Urine:1825; Blood:350] Intake/Output this shift: Total I/O In: 240 [P.O.:240] Out: 900 [Urine:900]  Recent Labs    05/22/19 0228  HGB 10.5*   Recent Labs    05/22/19 0228  WBC 12.5*  RBC 3.45*  HCT 31.7*  PLT 212   Recent Labs    05/22/19 0228  NA 136  K 4.2  CL 104  CO2 25  BUN 10  CREATININE 0.81  GLUCOSE 154*  CALCIUM 8.9   No results for input(s): LABPT, INR in the last 72 hours.  Sensation intact distally Intact pulses distally Dorsiflexion/Plantar flexion intact Incision: dressing C/D/I   Assessment/Plan: 1 Day Post-Op Procedure(s) (LRB): RIGHT TOTAL HIP ARTHROPLASTY ANTERIOR APPROACH (Right) Up with therapy Discharge home with home health    Patient's anticipated LOS is less than 2 midnights, meeting these requirements: - Younger than 32 - Lives within 1 hour of care - Has a competent adult at home to recover with post-op recover - NO history of  - Chronic pain requiring opiods  - Diabetes  - Coronary Artery Disease  - Heart failure  - Heart attack  - Stroke  - DVT/VTE  - Cardiac arrhythmia  - Respiratory Failure/COPD  - Renal failure  - Anemia  - Advanced Liver disease       Kathryne Hitch 05/22/2019, 10:33 AM

## 2019-05-22 NOTE — Plan of Care (Signed)
  Problem: Education: Goal: Knowledge of the prescribed therapeutic regimen will improve Outcome: Progressing Goal: Understanding of discharge needs will improve Outcome: Progressing Goal: Individualized Educational Video(s) Outcome: Progressing   Problem: Activity: Goal: Ability to avoid complications of mobility impairment will improve Outcome: Progressing Goal: Ability to tolerate increased activity will improve Outcome: Progressing   Problem: Clinical Measurements: Goal: Postoperative complications will be avoided or minimized Outcome: Progressing   Problem: Pain Management: Goal: Pain level will decrease with appropriate interventions Outcome: Progressing   Problem: Skin Integrity: Goal: Will show signs of wound healing Outcome: Progressing   Problem: Education: Goal: Knowledge of General Education information will improve Description: Including pain rating scale, medication(s)/side effects and non-pharmacologic comfort measures Outcome: Progressing   Problem: Health Behavior/Discharge Planning: Goal: Ability to manage health-related needs will improve Outcome: Progressing   Problem: Clinical Measurements: Goal: Ability to maintain clinical measurements within normal limits will improve Outcome: Progressing Goal: Will remain free from infection Outcome: Progressing Goal: Diagnostic test results will improve Outcome: Progressing Goal: Respiratory complications will improve Outcome: Progressing Goal: Cardiovascular complication will be avoided Outcome: Progressing   Problem: Activity: Goal: Risk for activity intolerance will decrease Outcome: Progressing   Problem: Nutrition: Goal: Adequate nutrition will be maintained Outcome: Progressing   Problem: Coping: Goal: Level of anxiety will decrease Outcome: Progressing   Problem: Elimination: Goal: Will not experience complications related to bowel motility Outcome: Progressing Goal: Will not experience  complications related to urinary retention Outcome: Progressing   Problem: Safety: Goal: Ability to remain free from injury will improve Outcome: Progressing   Problem: Pain Managment: Goal: General experience of comfort will improve Outcome: Progressing   Problem: Skin Integrity: Goal: Risk for impaired skin integrity will decrease Outcome: Progressing

## 2019-05-22 NOTE — Progress Notes (Signed)
   05/22/19 1432  PT Visit Information  Last PT Received On 05/22/19  Pt progressing well. Pain controlled. Reviewed stairs, pt able to safely return demo.  Pt is ready to d/c from PT standpoint.   Assistance Needed +1  History of Present Illness s/p  R DA THA. PMH: L DA THA  Subjective Data  Patient Stated Goal to start hiking again  Precautions  Precautions Fall  Restrictions  Weight Bearing Restrictions No  Other Position/Activity Restrictions WBAT  Pain Assessment  Pain Assessment 0-10  Pain Score 3  Pain Location R hip  Pain Descriptors / Indicators Grimacing;Aching;Sore  Pain Intervention(s) Limited activity within patient's tolerance;Monitored during session;Premedicated before session;Ice applied  Cognition  Arousal/Alertness Awake/alert  Behavior During Therapy WFL for tasks assessed/performed  Overall Cognitive Status Within Functional Limits for tasks assessed  Bed Mobility  General bed mobility comments in chair   Transfers  Overall transfer level Needs assistance  Equipment used Rolling walker (2 wheeled)  Transfers Sit to/from Stand  Sit to Stand Supervision  General transfer comment cues for hand placement, incr time  Ambulation/Gait  Ambulation/Gait assistance Supervision  Gait Distance (Feet) 130 Feet  Assistive device Rolling walker (2 wheeled)  Gait Pattern/deviations Step-through pattern;Decreased stride length  General Gait Details cues for sequence and use of UEs for decr WBing/pain control RLE; excellent progression of step through gait  Stairs Yes  Stairs assistance Min guard  Stair Management Two rails;Step to pattern;Forwards  Number of Stairs 3  General stair comments cues for sequence and technique   Total Joint Exercises  Ankle Circles/Pumps AROM;Both;10 reps  Quad Sets AROM;Both;10 reps  PT - End of Session  Equipment Utilized During Treatment Gait belt  Activity Tolerance Patient tolerated treatment well  Patient left in chair;with call  bell/phone within reach;with chair alarm set  Nurse Communication Mobility status   PT - Assessment/Plan  PT Plan Current plan remains appropriate  PT Visit Diagnosis Difficulty in walking, not elsewhere classified (R26.2)  PT Frequency (ACUTE ONLY) 7X/week  Follow Up Recommendations Follow surgeon's recommendation for DC plan and follow-up therapies  PT equipment Rolling walker with 5" wheels  AM-PAC PT "6 Clicks" Mobility Outcome Measure (Version 2)  Help needed turning from your back to your side while in a flat bed without using bedrails? 3  Help needed moving from lying on your back to sitting on the side of a flat bed without using bedrails? 3  Help needed moving to and from a bed to a chair (including a wheelchair)? 4  Help needed standing up from a chair using your arms (e.g., wheelchair or bedside chair)? 4  Help needed to walk in hospital room? 3  Help needed climbing 3-5 steps with a railing?  3  6 Click Score 20  Consider Recommendation of Discharge To: Home with no services  Acute Rehab PT Goals  PT Goal Formulation With patient  Time For Goal Achievement 05/28/19  Potential to Achieve Goals Good  PT Time Calculation  PT Start Time (ACUTE ONLY) 1343  PT Stop Time (ACUTE ONLY) 1357  PT Time Calculation (min) (ACUTE ONLY) 14 min  PT General Charges  $$ ACUTE PT VISIT 1 Visit  PT Treatments  $Gait Training 8-22 mins

## 2019-05-22 NOTE — Progress Notes (Signed)
Physical Therapy Treatment Patient Details Name: Michelle Ewing MRN: [id number] DOB: August 20, 1975 Today's Date: 05/22/2019    History of Present Illness s/p  R DA THA. PMH: L DA THA    PT Comments    Pt progressing very well. incr gait distance/tolerance. Pain well controlled. Will see for a second session and should be ready to d/c later today   Follow Up Recommendations  Follow surgeon's recommendation for DC plan and follow-up therapies     Equipment Recommendations  Rolling walker with 5" wheels    Recommendations for Other Services       Precautions / Restrictions Precautions Precautions: Fall Restrictions Weight Bearing Restrictions: No Other Position/Activity Restrictions: WBAT    Mobility  Bed Mobility Overal bed mobility: Needs Assistance Bed Mobility: Supine to Sit     Supine to sit: Min assist;Min guard     General bed mobility comments: assist with RLE  Transfers Overall transfer level: Needs assistance Equipment used: Rolling walker (2 wheeled) Transfers: Sit to/from Stand Sit to Stand: Min guard         General transfer comment: cues for hand placement, incr time  Ambulation/Gait Ambulation/Gait assistance: Min guard;Supervision Gait Distance (Feet): 160 Feet(15' more ) Assistive device: Rolling walker (2 wheeled) Gait Pattern/deviations: Step-to pattern;Decreased stance time - right;Decreased weight shift to right;Decreased step length - right;Decreased step length - left     General Gait Details: cues for sequence and use of UEs for decr WBing/pain control RLE; excellent progression of step through gait   Stairs             Wheelchair Mobility    Modified Rankin (Stroke Patients Only)       Balance                                            Cognition Arousal/Alertness: Awake/alert Behavior During Therapy: WFL for tasks assessed/performed Overall Cognitive Status: Within Functional Limits for tasks  assessed                                        Exercises Total Joint Exercises Ankle Circles/Pumps: AROM;Both;10 reps Quad Sets: AROM;Both;10 reps Heel Slides: AAROM;Right;10 reps Hip ABduction/ADduction: AROM;AAROM;Right;10 reps Long Arc Quad: AROM;Right;10 reps;Seated    General Comments        Pertinent Vitals/Pain Pain Assessment: 0-10 Pain Score: 3  Pain Location: R hip Pain Descriptors / Indicators: Grimacing;Aching;Sore Pain Intervention(s): Limited activity within patient's tolerance;Monitored during session;Premedicated before session;Repositioned    Home Living                      Prior Function            PT Goals (current goals can now be found in the care plan section) Acute Rehab PT Goals Patient Stated Goal: to start hiking again PT Goal Formulation: With patient Time For Goal Achievement: 05/28/19 Potential to Achieve Goals: Good Progress towards PT goals: Progressing toward goals    Frequency    7X/week      PT Plan Current plan remains appropriate    Co-evaluation              AM-PAC PT "6 Clicks" Mobility   Outcome Measure  Help needed turning from your back to your side while  in a flat bed without using bedrails?: A Little Help needed moving from lying on your back to sitting on the side of a flat bed without using bedrails?: A Little Help needed moving to and from a bed to a chair (including a wheelchair)?: A Little Help needed standing up from a chair using your arms (e.g., wheelchair or bedside chair)?: A Little Help needed to walk in hospital room?: A Little Help needed climbing 3-5 steps with a railing? : A Little 6 Click Score: 18    End of Session   Activity Tolerance: Patient tolerated treatment well Patient left: in chair;with call bell/phone within reach;with chair alarm set Nurse Communication: Mobility status PT Visit Diagnosis: Difficulty in walking, not elsewhere classified (R26.2)      Time: 6659-9357 PT Time Calculation (min) (ACUTE ONLY): 39 min  Charges:  $Gait Training: 23-37 mins $Therapeutic Exercise: 8-22 mins                     Kairee Isa, PT   Acute Rehab Dept [facility]): [phone]   05/22/2019    [facility] 05/22/2019, 10:13 AM

## 2019-05-22 NOTE — TOC Progression Note (Signed)
Transition of Care [facility]) - Progression Note    Patient Details  Name: Michelle Ewing MRN: [id number] Date of Birth: 12-05-1975  [facility] CM/SW Contact  Armanda Heritage, RN Phone Number: 05/22/2019, 12:02 PM  Clinical Narrative:    Patient set up with HHPT with [facility].  [employer] to deliver rolling walker to bedside, patient declines 3in1.  Patient reports she is going to stay with her friends for the first week at [address] [place], [place] [address].  [facility] [place] rep notified of this.     Expected Discharge Plan: Home w Home Health Services Barriers to Discharge: No Barriers Identified  Expected Discharge Plan and Services Expected Discharge Plan: Home w Home Health Services   Discharge Planning Services: CM Consult Post Acute Care Choice: Home Health Living arrangements for the past 2 months: Single Family Home Expected Discharge Date: 05/22/19               DME Arranged: Dan Humphreys rolling DME Agency: [employer] Date DME Agency Contacted: 05/22/19 Time DME Agency Contacted: [phone] Representative spoke with at DME Agency: Ledell Noss HH Arranged: PT HH Agency: [facility] (formerly [employer]) Date HH Agency Contacted: 05/22/19 Time HH Agency Contacted: 1201 Representative spoke with at [facility] Agency: Laurelyn Sickle   Social Determinants of Health (SDOH) Interventions    Readmission Risk Interventions No flowsheet data found.

## 2019-05-22 NOTE — Discharge Summary (Signed)
Patient ID: Michelle Ewing MRN: [id number] DOB/AGE: Oct 22, 1975 44 y.o.  Admit date: 05/21/2019 Discharge date: 05/22/2019  Admission Diagnoses:  Principal Problem:   Unilateral primary osteoarthritis, right hip Active Problems:   Status post total replacement of right hip   Discharge Diagnoses:  Same  Past Medical History:  Diagnosis Date  . Anxiety 2014  . Arthritis   . ASCUS with positive high risk HPV   . Depression   . Exercise-induced asthma   . Migraine with aura   . Smoker     Surgeries: Procedure(s): RIGHT TOTAL HIP ARTHROPLASTY ANTERIOR APPROACH on 05/21/2019   Consultants:   Discharged Condition: Improved  Hospital Course: Michelle Ewing is an 44 y.o. female who was admitted 05/21/2019 for operative treatment ofUnilateral primary osteoarthritis, right hip. Patient has severe unremitting pain that affects sleep, daily activities, and work/hobbies. After pre-op clearance the patient was taken to the operating room on 05/21/2019 and underwent  Procedure(s): RIGHT TOTAL HIP ARTHROPLASTY ANTERIOR APPROACH.    Patient was given perioperative antibiotics:  Anti-infectives (From admission, onward)   Start     Dose/Rate Route Frequency Ordered Stop   05/21/19 1400  ceFAZolin (ANCEF) IVPB 1 g/50 mL premix     1 g 100 mL/hr over 30 Minutes Intravenous Every 6 hours 05/21/19 1126 05/21/19 2127   05/21/19 0615  ceFAZolin (ANCEF) IVPB 2g/100 mL premix     2 g 200 mL/hr over 30 Minutes Intravenous On call to O.R. 05/21/19 0604 05/21/19 0831       Patient was given sequential compression devices, early ambulation, and chemoprophylaxis to prevent DVT.  Patient benefited maximally from hospital stay and there were no complications.    Recent vital signs:  Patient Vitals for the past 24 hrs:  BP Temp Temp src Pulse Resp SpO2  05/22/19 0952 121/80 98.2 F (36.8 C) Oral 82 18 95 %  05/22/19 0649 116/65 98.5 F (36.9 C) Oral 80 16 100 %  05/22/19 0136 104/64 98.9 F (37.2 C) --  72 14 98 %  05/21/19 2259 110/67 98.2 F (36.8 C) Oral 74 16 100 %  05/21/19 1438 (!) 151/83 -- -- 85 16 98 %  05/21/19 1341 (!) 141/91 (!) 97.4 F (36.3 C) Oral 84 16 100 %  05/21/19 1227 117/79 97.8 F (36.6 C) Oral 77 16 100 %  05/21/19 1124 126/80 97.6 F (36.4 C) -- 70 -- 100 %  05/21/19 1100 122/85 (!) 97.4 F (36.3 C) -- 62 14 100 %  05/21/19 1045 124/88 -- -- 70 12 100 %     Recent laboratory studies:  Recent Labs    05/22/19 0228  WBC 12.5*  HGB 10.5*  HCT 31.7*  PLT 212  NA 136  K 4.2  CL 104  CO2 25  BUN 10  CREATININE 0.81  GLUCOSE 154*  CALCIUM 8.9     Discharge Medications:   Allergies as of 05/22/2019      Reactions   Bacitracin    welts   Peroxide [hydrogen Peroxide]    welt   Benzalkonium Chloride Rash   Neosporin [neomycin-bacitracin Zn-polymyx] Rash      Medication List    TAKE these medications   aspirin 81 MG chewable tablet Chew 1 tablet (81 mg total) by mouth 2 (two) times daily.   Biotin 5000 5 MG Caps Generic drug: Biotin Take 5 mg by mouth daily.   DULoxetine 30 MG capsule Commonly known as: CYMBALTA TAKE 1 CAPSULE BY MOUTH EVERY DAY  folic acid 782 MCG tablet Commonly known as: FOLVITE Take 400 mcg by mouth daily.   ibuprofen 200 MG tablet Commonly known as: ADVIL Take 800 mg by mouth daily as needed for headache or moderate pain.   Melatonin 10 MG Caps Take 10 mg by mouth at bedtime as needed (sleep).   methocarbamol 500 MG tablet Commonly known as: ROBAXIN Take 1 tablet (500 mg total) by mouth every 6 (six) hours as needed for muscle spasms.   multivitamin with minerals tablet Take 2 tablets by mouth daily.   AIRBORNE GUMMIES PO Take 3 tablets by mouth daily.   oxyCODONE 5 MG immediate release tablet Commonly known as: Oxy IR/ROXICODONE Take 1-2 tablets (5-10 mg total) by mouth every 4 (four) hours as needed for moderate pain (pain score 4-6).   PROBIOTIC PO Take 1 capsule by mouth daily.    valACYclovir 500 MG tablet Commonly known as: VALTREX Take one tablet bid at onset of outbreak for 3 days What changed:   how much to take  how to take this  when to take this  reasons to take this  additional instructions   Vitamin D 50 MCG (2000 UT) tablet Take 4,000 Units by mouth daily.            Durable Medical Equipment  (From admission, onward)         Start     Ordered   05/21/19 1127  DME 3 n 1  Once     05/21/19 1126   05/21/19 1127  DME Walker rolling  Once    Question Answer Comment  Walker: With 5 Inch Wheels   Patient needs a walker to treat with the following condition Status post total replacement of right hip      05/21/19 1126          Diagnostic Studies: MR Hip Right w/ contrast  Result Date: 04/24/2019 CLINICAL DATA:  Right hip pain for 6 months.  No known injury. EXAM: MRI OF THE RIGHT HIP WITH CONTRAST (MR Arthrogram) TECHNIQUE: Multiplanar, multisequence MR imaging of the hip was performed immediately following contrast injection into the hip joint under fluoroscopic guidance. No intravenous contrast was administered. COMPARISON:  Plain films right hip 03/29/2019. FINDINGS: Bones: There is some subchondral cyst formation about the right hip. No fracture, stress change or worrisome lesion. No avascular necrosis of the femoral head. Left hip replacement noted. Articular cartilage and labrum Articular cartilage: Mildly to moderately thinned throughout without focal defect. Labrum: The anterior and superior labrum are severely degenerated and torn throughout. Joint or bursal effusion Joint effusion:  The right hip is distended with contrast. Bursae: Negative. Muscles and tendons Muscles and tendons:  Intact. Other findings Miscellaneous:   None. IMPRESSION: Advanced for age appearing right hip osteoarthritis with associated severe degenerative tearing of the anterior and superior right labrum. Status post left hip replacement. Electronically Signed    By: Inge Rise M.D.   On: 04/24/2019 09:46   DG Pelvis Portable  Result Date: 05/21/2019 CLINICAL DATA:  Postop hip replacement EXAM: PORTABLE PELVIS 1-2 VIEWS COMPARISON:  03/29/2019 FINDINGS: Interval hip replacement on the right. Components are well positioned. No radiographically detectable complication. Previous hip replacement on the left. IMPRESSION: Good appearance following total hip arthroplasty on the right. Electronically Signed   By: Nelson Chimes M.D.   On: 05/21/2019 10:54   Arthrogram  Result Date: 04/23/2019 CLINICAL DATA:  Right hip pain. EXAM: RIGHT HIP INJECTION FOR MRI FLUOROSCOPY TIME:  Fluoroscopy  Time:  13 seconds Radiation Exposure Index (if provided by the fluoroscopic device): 14.14 microGray*m^2 Number of Acquired Spot Images: 0 PROCEDURE: The overlying skin was prepped with Betadine, draped in the usual sterile fashion, and infiltrated locally with 1% lidocaine. A 3.5 inch 22 gauge spinal needle was advanced to the lateral aspect of the right femoral head-neck junction. 1 mL of 1% lidocaine injected easily. A mixture of 0.1 mL of MultiHance, 15 mL of Isovue-M 200, and 5 mL of sterile saline was then used to opacify the right femoral head. 10 mL of this mixture were injected. The needle was removed and a sterile dressing was applied. There was no immediate complication. IMPRESSION: Technically successful right hip injection under fluoroscopy for MR arthrogram. Electronically Signed   By: Sebastian Ache M.D.   On: 04/23/2019 16:00   DG C-Arm 1-60 Min-No Report  Result Date: 05/21/2019 CLINICAL DATA:  Status post total hip replacement on the right EXAM: OPERATIVE RIGHT HIP (WITH PELVIS IF PERFORMED) 2 VIEWS TECHNIQUE: Fluoroscopic spot image(s) were submitted for interpretation post-operatively. COMPARISON:  March 29, 2019 pelvis radiograph FINDINGS: Initial frontal image of the right hip shows slight narrowing with lucencies in the femoral head, concerning for a degree of  underlying avascular necrosis. Note that there is a total hip replacement on the left with prosthetic components well-seated. Subsequently, frontal view shows total hip replacement on the right with prosthetic components well-seated. No fracture or dislocation. IMPRESSION: Total hip replacement performed on the right with prosthetic components well-seated. No fracture or dislocation. Prior total hip replacement noted on the left. Electronically Signed   By: Bretta Bang III M.D.   On: 05/21/2019 10:40   DG HIP OPERATIVE UNILAT W OR W/O PELVIS RIGHT  Result Date: 05/21/2019 CLINICAL DATA:  Status post total hip replacement on the right EXAM: OPERATIVE RIGHT HIP (WITH PELVIS IF PERFORMED) 2 VIEWS TECHNIQUE: Fluoroscopic spot image(s) were submitted for interpretation post-operatively. COMPARISON:  March 29, 2019 pelvis radiograph FINDINGS: Initial frontal image of the right hip shows slight narrowing with lucencies in the femoral head, concerning for a degree of underlying avascular necrosis. Note that there is a total hip replacement on the left with prosthetic components well-seated. Subsequently, frontal view shows total hip replacement on the right with prosthetic components well-seated. No fracture or dislocation. IMPRESSION: Total hip replacement performed on the right with prosthetic components well-seated. No fracture or dislocation. Prior total hip replacement noted on the left. Electronically Signed   By: Bretta Bang III M.D.   On: 05/21/2019 10:40    Disposition: Discharge disposition: 01-Home or Self Care            Signed: Kathryne Hitch 05/22/2019, 10:33 AM

## 2019-05-23 DIAGNOSIS — F17210 Nicotine dependence, cigarettes, uncomplicated: Secondary | ICD-10-CM | POA: Diagnosis not present

## 2019-05-23 DIAGNOSIS — Z9181 History of falling: Secondary | ICD-10-CM | POA: Diagnosis not present

## 2019-05-23 DIAGNOSIS — J45909 Unspecified asthma, uncomplicated: Secondary | ICD-10-CM | POA: Diagnosis not present

## 2019-05-23 DIAGNOSIS — Z96642 Presence of left artificial hip joint: Secondary | ICD-10-CM | POA: Diagnosis not present

## 2019-05-23 DIAGNOSIS — Z471 Aftercare following joint replacement surgery: Secondary | ICD-10-CM | POA: Diagnosis not present

## 2019-05-23 DIAGNOSIS — F419 Anxiety disorder, unspecified: Secondary | ICD-10-CM | POA: Diagnosis not present

## 2019-05-23 DIAGNOSIS — G43109 Migraine with aura, not intractable, without status migrainosus: Secondary | ICD-10-CM | POA: Diagnosis not present

## 2019-05-23 DIAGNOSIS — F329 Major depressive disorder, single episode, unspecified: Secondary | ICD-10-CM | POA: Diagnosis not present

## 2019-05-23 DIAGNOSIS — Z7982 Long term (current) use of aspirin: Secondary | ICD-10-CM | POA: Diagnosis not present

## 2019-05-24 ENCOUNTER — Telehealth: Payer: Self-pay | Admitting: Orthopaedic Surgery

## 2019-05-24 MED ORDER — TEMAZEPAM 7.5 MG PO CAPS: 7.5000 mg | ORAL_CAPSULE | Freq: Every evening | ORAL | 0 refills | Status: DC | PRN

## 2019-05-24 MED ORDER — OXYCODONE HCL 5 MG PO TABS: 5.0000 mg | ORAL_TABLET | ORAL | 0 refills | Status: DC | PRN

## 2019-05-24 NOTE — Telephone Encounter (Signed)
Verbal order given  

## 2019-05-24 NOTE — Telephone Encounter (Signed)
I sent in some more oxycodone as well as some Restoril for sleep.  I would also have her take at least 2 Aleve or 3 Advil with a snack about an hour before bedtime.

## 2019-05-24 NOTE — Telephone Encounter (Signed)
Michelle Ewing from [facility] called requesting verbal orders for Home Health Physical Therapy 3 times a week for 2 weeks.Michelle Ewing phone number is [phone].. If need to call leave a detailed message.

## 2019-05-24 NOTE — Telephone Encounter (Signed)
Patient aware.

## 2019-05-24 NOTE — Telephone Encounter (Signed)
Patient's care giver called stating that the pain medication is not touching the pain at night, but it is tolerable during the day.  She is also having trouble sleeping at night due to the pain and not getting comfortable.  She is wanting to know if Dr. Magnus Ivan would call her in something to sleep.  Patient is also needing a refill on the Oxycodone 5 mg.  Patient uses [facility] at the corner of [address]  CB#[phone].  Thank you.

## 2019-05-25 ENCOUNTER — Encounter: Payer: Self-pay | Admitting: *Deleted

## 2019-05-26 DIAGNOSIS — Z9181 History of falling: Secondary | ICD-10-CM | POA: Diagnosis not present

## 2019-05-26 DIAGNOSIS — F17210 Nicotine dependence, cigarettes, uncomplicated: Secondary | ICD-10-CM | POA: Diagnosis not present

## 2019-05-26 DIAGNOSIS — Z471 Aftercare following joint replacement surgery: Secondary | ICD-10-CM | POA: Diagnosis not present

## 2019-05-26 DIAGNOSIS — F419 Anxiety disorder, unspecified: Secondary | ICD-10-CM | POA: Diagnosis not present

## 2019-05-26 DIAGNOSIS — J45909 Unspecified asthma, uncomplicated: Secondary | ICD-10-CM | POA: Diagnosis not present

## 2019-05-26 DIAGNOSIS — Z96642 Presence of left artificial hip joint: Secondary | ICD-10-CM | POA: Diagnosis not present

## 2019-05-26 DIAGNOSIS — F329 Major depressive disorder, single episode, unspecified: Secondary | ICD-10-CM | POA: Diagnosis not present

## 2019-05-26 DIAGNOSIS — Z7982 Long term (current) use of aspirin: Secondary | ICD-10-CM | POA: Diagnosis not present

## 2019-05-26 DIAGNOSIS — G43109 Migraine with aura, not intractable, without status migrainosus: Secondary | ICD-10-CM | POA: Diagnosis not present

## 2019-05-28 DIAGNOSIS — G43109 Migraine with aura, not intractable, without status migrainosus: Secondary | ICD-10-CM | POA: Diagnosis not present

## 2019-05-28 DIAGNOSIS — Z7982 Long term (current) use of aspirin: Secondary | ICD-10-CM | POA: Diagnosis not present

## 2019-05-28 DIAGNOSIS — F329 Major depressive disorder, single episode, unspecified: Secondary | ICD-10-CM | POA: Diagnosis not present

## 2019-05-28 DIAGNOSIS — Z471 Aftercare following joint replacement surgery: Secondary | ICD-10-CM | POA: Diagnosis not present

## 2019-05-28 DIAGNOSIS — Z9181 History of falling: Secondary | ICD-10-CM | POA: Diagnosis not present

## 2019-05-28 DIAGNOSIS — Z96642 Presence of left artificial hip joint: Secondary | ICD-10-CM | POA: Diagnosis not present

## 2019-05-28 DIAGNOSIS — F419 Anxiety disorder, unspecified: Secondary | ICD-10-CM | POA: Diagnosis not present

## 2019-05-28 DIAGNOSIS — F17210 Nicotine dependence, cigarettes, uncomplicated: Secondary | ICD-10-CM | POA: Diagnosis not present

## 2019-05-28 DIAGNOSIS — J45909 Unspecified asthma, uncomplicated: Secondary | ICD-10-CM | POA: Diagnosis not present

## 2019-06-01 DIAGNOSIS — Z96642 Presence of left artificial hip joint: Secondary | ICD-10-CM | POA: Diagnosis not present

## 2019-06-01 DIAGNOSIS — Z471 Aftercare following joint replacement surgery: Secondary | ICD-10-CM | POA: Diagnosis not present

## 2019-06-01 DIAGNOSIS — F17210 Nicotine dependence, cigarettes, uncomplicated: Secondary | ICD-10-CM | POA: Diagnosis not present

## 2019-06-01 DIAGNOSIS — J45909 Unspecified asthma, uncomplicated: Secondary | ICD-10-CM | POA: Diagnosis not present

## 2019-06-01 DIAGNOSIS — F329 Major depressive disorder, single episode, unspecified: Secondary | ICD-10-CM | POA: Diagnosis not present

## 2019-06-01 DIAGNOSIS — G43109 Migraine with aura, not intractable, without status migrainosus: Secondary | ICD-10-CM | POA: Diagnosis not present

## 2019-06-01 DIAGNOSIS — Z7982 Long term (current) use of aspirin: Secondary | ICD-10-CM | POA: Diagnosis not present

## 2019-06-01 DIAGNOSIS — F419 Anxiety disorder, unspecified: Secondary | ICD-10-CM | POA: Diagnosis not present

## 2019-06-01 DIAGNOSIS — Z9181 History of falling: Secondary | ICD-10-CM | POA: Diagnosis not present

## 2019-06-03 DIAGNOSIS — Z9181 History of falling: Secondary | ICD-10-CM | POA: Diagnosis not present

## 2019-06-03 DIAGNOSIS — J45909 Unspecified asthma, uncomplicated: Secondary | ICD-10-CM | POA: Diagnosis not present

## 2019-06-03 DIAGNOSIS — Z471 Aftercare following joint replacement surgery: Secondary | ICD-10-CM | POA: Diagnosis not present

## 2019-06-03 DIAGNOSIS — Z96642 Presence of left artificial hip joint: Secondary | ICD-10-CM | POA: Diagnosis not present

## 2019-06-03 DIAGNOSIS — F419 Anxiety disorder, unspecified: Secondary | ICD-10-CM | POA: Diagnosis not present

## 2019-06-03 DIAGNOSIS — G43109 Migraine with aura, not intractable, without status migrainosus: Secondary | ICD-10-CM | POA: Diagnosis not present

## 2019-06-03 DIAGNOSIS — F329 Major depressive disorder, single episode, unspecified: Secondary | ICD-10-CM | POA: Diagnosis not present

## 2019-06-03 DIAGNOSIS — Z7982 Long term (current) use of aspirin: Secondary | ICD-10-CM | POA: Diagnosis not present

## 2019-06-03 DIAGNOSIS — F17210 Nicotine dependence, cigarettes, uncomplicated: Secondary | ICD-10-CM | POA: Diagnosis not present

## 2019-06-04 DIAGNOSIS — Z96642 Presence of left artificial hip joint: Secondary | ICD-10-CM | POA: Diagnosis not present

## 2019-06-04 DIAGNOSIS — G43109 Migraine with aura, not intractable, without status migrainosus: Secondary | ICD-10-CM | POA: Diagnosis not present

## 2019-06-04 DIAGNOSIS — Z471 Aftercare following joint replacement surgery: Secondary | ICD-10-CM | POA: Diagnosis not present

## 2019-06-04 DIAGNOSIS — J45909 Unspecified asthma, uncomplicated: Secondary | ICD-10-CM | POA: Diagnosis not present

## 2019-06-04 DIAGNOSIS — Z9181 History of falling: Secondary | ICD-10-CM | POA: Diagnosis not present

## 2019-06-04 DIAGNOSIS — F329 Major depressive disorder, single episode, unspecified: Secondary | ICD-10-CM | POA: Diagnosis not present

## 2019-06-04 DIAGNOSIS — Z7982 Long term (current) use of aspirin: Secondary | ICD-10-CM | POA: Diagnosis not present

## 2019-06-04 DIAGNOSIS — F419 Anxiety disorder, unspecified: Secondary | ICD-10-CM | POA: Diagnosis not present

## 2019-06-04 DIAGNOSIS — F17210 Nicotine dependence, cigarettes, uncomplicated: Secondary | ICD-10-CM | POA: Diagnosis not present

## 2019-06-07 ENCOUNTER — Other Ambulatory Visit: Payer: Self-pay

## 2019-06-07 ENCOUNTER — Ambulatory Visit (INDEPENDENT_AMBULATORY_CARE_PROVIDER_SITE_OTHER): Payer: BC Managed Care – PPO | Admitting: Orthopaedic Surgery

## 2019-06-07 ENCOUNTER — Encounter: Payer: Self-pay | Admitting: Orthopaedic Surgery

## 2019-06-07 DIAGNOSIS — Z96641 Presence of right artificial hip joint: Secondary | ICD-10-CM

## 2019-06-07 NOTE — Progress Notes (Signed)
The patient is 2 weeks and 3 days status post a right total hip arthroplasty.  She is only ambulating using a cane.  She is very pleased overall with her range of motion and strength.  She has been taking 2 baby aspirin daily.  She is stopped narcotics and is taking Aleve in the morning.  She does take an occasional Robaxin at bedtime.  On exam, her hip incision looks good.  Remove the old Steri-Strips in place new ones.  Her leg lengths are close to equal.  She may be just a millimeter long on the right side but it does not seem to bother her with her gait.  Overall she does look good.  At this standpoint she will go down to just 1 aspirin daily for a week and then can stop her aspirin.  She will continue to increase her activities as comfort allows.  I will see her back in 4 weeks to see how she is doing overall clinically but no x-rays are needed.

## 2019-06-17 ENCOUNTER — Other Ambulatory Visit: Payer: Self-pay | Admitting: Orthopaedic Surgery

## 2019-06-17 NOTE — Telephone Encounter (Signed)
Continue or discontinue?

## 2019-06-23 ENCOUNTER — Other Ambulatory Visit: Payer: Self-pay | Admitting: Obstetrics and Gynecology

## 2019-06-23 DIAGNOSIS — Z1231 Encounter for screening mammogram for malignant neoplasm of breast: Secondary | ICD-10-CM

## 2019-06-28 ENCOUNTER — Ambulatory Visit: Payer: BC Managed Care – PPO

## 2019-07-05 ENCOUNTER — Other Ambulatory Visit: Payer: Self-pay

## 2019-07-05 ENCOUNTER — Ambulatory Visit (INDEPENDENT_AMBULATORY_CARE_PROVIDER_SITE_OTHER): Payer: BC Managed Care – PPO | Admitting: Orthopaedic Surgery

## 2019-07-05 ENCOUNTER — Encounter: Payer: Self-pay | Admitting: Orthopaedic Surgery

## 2019-07-05 DIAGNOSIS — Z96641 Presence of right artificial hip joint: Secondary | ICD-10-CM

## 2019-07-05 NOTE — Progress Notes (Signed)
Michelle Ewing is only 6 weeks status post a right total hip arthroplasty direct anterior approach.  She had her left hip replaced about 3 years ago through an anterior lateral muscle-sparing approach.  This was done at [facility].  She is very pleased with both her hips.  She is doing great overall and is ready to return to full work duties tomorrow without restrictions.  She does report some numbness around both my incision on the right side and the left hip incision.  She denies any leg length issues.  She is walking without a limp and has a normal-appearing gait.  Both hips move smoothly and fluidly and full.  Her leg lengths are equal.  At this point we really do not need to see her back for 6 months.  Unless she is having issues we can wait until then to x-ray her hip.  At that visit I like a standing low AP pelvis and lateral of her right operative hip.  All questions and concerns were answered and addressed.  She can return to full work duties tomorrow.

## 2019-07-23 DIAGNOSIS — R42 Dizziness and giddiness: Secondary | ICD-10-CM | POA: Diagnosis not present

## 2019-08-13 ENCOUNTER — Ambulatory Visit
Admission: RE | Admit: 2019-08-13 | Discharge: 2019-08-13 | Disposition: A | Discharge status: Home / Self Care | Payer: BC Managed Care – PPO | Source: Ambulatory Visit | Attending: Obstetrics and Gynecology | Admitting: Obstetrics and Gynecology

## 2019-08-13 ENCOUNTER — Other Ambulatory Visit: Payer: Self-pay

## 2019-08-13 DIAGNOSIS — Z1231 Encounter for screening mammogram for malignant neoplasm of breast: Secondary | ICD-10-CM | POA: Diagnosis not present

## 2019-08-21 DIAGNOSIS — Z20822 Contact with and (suspected) exposure to covid-19: Secondary | ICD-10-CM | POA: Diagnosis not present

## 2020-01-05 ENCOUNTER — Ambulatory Visit: Payer: Self-pay

## 2020-01-05 ENCOUNTER — Encounter: Payer: Self-pay | Admitting: Orthopaedic Surgery

## 2020-01-05 ENCOUNTER — Ambulatory Visit (INDEPENDENT_AMBULATORY_CARE_PROVIDER_SITE_OTHER): Payer: BC Managed Care – PPO | Admitting: Orthopaedic Surgery

## 2020-01-05 DIAGNOSIS — Z96641 Presence of right artificial hip joint: Secondary | ICD-10-CM | POA: Diagnosis not present

## 2020-01-05 NOTE — Progress Notes (Signed)
The patient is now close to 8 months out from a right total hip arthroplasty.  This was done through a direct anterior approach.  She also had a left total hip arthroplasty done around 2017 or so at [facility].  Even though this was described as an anterior approach it was more of a lateral approach.  She says the left hip does still hurt on occasion but the right hip has had no issues at all.  She reports good strength and mobility.  She is only 44 years old and very active.  She does a lot of Pilates.  Her mother does have a history of psoriatic arthritis.  She is concerned about left shoulder pain and a lot of clicking in her right elbow.  On exam both hips move smoothly and fluidly with no issues at all.  When I have her flex and extend the right elbow I can feel some clicking.  It is asymptomatic for her.  She does have some stiffness when I see her abduct her shoulder and reach overhead with forward flexion on the left side.  An AP pelvis and lateral of the right hip shows both her hip replacements and they are well-seated with no complicating features.  There is evidence of bone ingrowth on both hips.  At this point follow-up can be as needed since she is doing well.  All question concerns were answered and addressed.  If she develops any issues with any joints including hips she knows to come see [place] and contact us.  All questions and concerns were answered and addressed.

## 2020-01-10 DIAGNOSIS — N760 Acute vaginitis: Secondary | ICD-10-CM | POA: Diagnosis not present

## 2020-01-10 DIAGNOSIS — R110 Nausea: Secondary | ICD-10-CM | POA: Diagnosis not present

## 2020-01-11 DIAGNOSIS — Z03818 Encounter for observation for suspected exposure to other biological agents ruled out: Secondary | ICD-10-CM | POA: Diagnosis not present

## 2020-01-11 DIAGNOSIS — R110 Nausea: Secondary | ICD-10-CM | POA: Diagnosis not present

## 2020-01-25 ENCOUNTER — Other Ambulatory Visit: Payer: Self-pay

## 2020-01-25 DIAGNOSIS — B009 Herpesviral infection, unspecified: Secondary | ICD-10-CM

## 2020-01-25 MED ORDER — VALACYCLOVIR HCL 500 MG PO TABS: ORAL_TABLET | ORAL | 0 refills | Status: DC

## 2020-01-25 NOTE — Telephone Encounter (Signed)
Requesting refill on valacyclovir. [facility] [address] [phone].

## 2020-01-25 NOTE — Telephone Encounter (Signed)
Medication refill request: Valacyclovir 500mg   Last AEX:  04/02/19  Next AEX: 04/04/20 Last MMG (if hormonal medication request): 08/13/19  Neg  Refill authorized: 30/0

## 2020-01-31 ENCOUNTER — Telehealth: Payer: Self-pay

## 2020-01-31 ENCOUNTER — Encounter: Payer: Self-pay | Admitting: Obstetrics and Gynecology

## 2020-01-31 ENCOUNTER — Other Ambulatory Visit: Payer: Self-pay

## 2020-01-31 ENCOUNTER — Ambulatory Visit (INDEPENDENT_AMBULATORY_CARE_PROVIDER_SITE_OTHER): Payer: BC Managed Care – PPO | Admitting: Obstetrics and Gynecology

## 2020-01-31 VITALS — BP 110/70 | HR 84 | Temp 98.2°F | Ht 67.0 in | Wt 192.4 lb

## 2020-01-31 DIAGNOSIS — R590 Localized enlarged lymph nodes: Secondary | ICD-10-CM | POA: Diagnosis not present

## 2020-01-31 DIAGNOSIS — Z113 Encounter for screening for infections with a predominantly sexual mode of transmission: Secondary | ICD-10-CM | POA: Diagnosis not present

## 2020-01-31 DIAGNOSIS — Z8619 Personal history of other infectious and parasitic diseases: Secondary | ICD-10-CM | POA: Diagnosis not present

## 2020-01-31 DIAGNOSIS — N766 Ulceration of vulva: Secondary | ICD-10-CM

## 2020-01-31 MED ORDER — IBUPROFEN 800 MG PO TABS: 800.0000 mg | ORAL_TABLET | Freq: Three times a day (TID) | ORAL | 1 refills | Status: AC | PRN

## 2020-01-31 NOTE — Progress Notes (Signed)
GYNECOLOGY  VISIT   HPI: 44 y.o.   Single   Black or African American White or Caucasian Not Hispanic or Latino  female   [phone] with Patient's last menstrual period was 01/28/2020.   here for swollen, tender groin lymph nodes bilaterally. She states that her symptoms started Wednesday. She had a new sexual partner 2 weeks ago. No vulvar discomfort, on her cycle but no abnormal discharge. No abdominal pain, no bowel or bladder c/o. She felt slightly off yesterday, possible fever. Woke up with sweats during the night. Not feeling feverish today.  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 Infrequent outbreaks and never gets swollen, tender lymph nodes.  Two weeks before that she had bv. She is not having bv symptoms was treated with antibiotic x 7 days.   GYNECOLOGIC HISTORY: Patient's last menstrual period was 01/28/2020. Contraception:none  Menopausal hormone therapy: none         OB History    Gravida  5   Para  2   Term  2   Preterm      AB  3   Living  2     SAB  1   TAB  2   Ectopic      Multiple      Live Births  2              Patient Active Problem List   Diagnosis Date Noted  . Status post total replacement of right hip 05/21/2019  . Unilateral primary osteoarthritis, right hip 04/27/2019  . Follow-up examination after orthopedic surgery 11/05/2016  . Aftercare following left hip joint replacement surgery 11/17/2015  . History of total left hip arthroplasty 11/17/2015  . Status post left hip replacement 10/31/2015  . Migraine 10/18/2015  . Primary osteoarthritis of left hip 09/28/2015    Past Medical History:  Diagnosis Date  . Anxiety 2014  . Arthritis   . ASCUS with positive high risk HPV   . Depression   . Exercise-induced asthma   . Migraine with aura   . Smoker     Past Surgical History:  Procedure Laterality Date  . 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 TOTAL HIP ARTHROPLASTY  left  . TOTAL HIP ARTHROPLASTY  Right 05/21/2019   Procedure: RIGHT TOTAL HIP ARTHROPLASTY ANTERIOR APPROACH;  Surgeon: Kathryne Hitch, MD;  Location: [facility];  Service: Orthopedics;  Laterality: Right;    Current Outpatient Medications  Medication Sig Dispense Refill  . Biotin (BIOTIN 5000) 5 MG CAPS Take 5 mg by mouth daily.    . Cholecalciferol (VITAMIN D) 50 MCG (2000 UT) tablet Take 4,000 Units by mouth daily.    . DULoxetine (CYMBALTA) 30 MG capsule TAKE 1 CAPSULE BY MOUTH EVERY DAY 90 capsule 2  . folic acid (FOLVITE) 400 MCG tablet Take 400 mcg by mouth daily.    Marland Kitchen ibuprofen (ADVIL) 200 MG tablet Take 800 mg by mouth daily as needed for headache or moderate pain.    . Melatonin 10 MG CAPS Take 10 mg by mouth at bedtime as needed (sleep).    . Multiple Vitamins-Minerals (MULTIVITAMIN WITH MINERALS) tablet Take 2 tablets by mouth daily.     . valACYclovir (VALTREX) 500 MG tablet Take one tablet bid at onset of outbreak for 3 days 30 tablet 0   No current facility-administered medications for this visit.     ALLERGIES: Bacitracin, Peroxide [hydrogen peroxide], Benzalkonium chloride, and Neosporin [  neomycin-bacitracin zn-polymyx]  Family History  Problem Relation Age of Onset  . Migraines Maternal Grandmother   . Heart disease Maternal Grandmother   . Hodgkin's lymphoma Maternal Grandfather     Social History   Socioeconomic History  . Marital status: Single    Spouse name: Not on file  . Number of children: Not on file  . Years of education: Not on file  . Highest education level: Not on file  Occupational History  . Not on file  Tobacco Use  . Smoking status: Current Some Day Smoker    Types: Cigarettes  . Smokeless tobacco: Never Used  . Tobacco comment: smokes when has a glass of alcohol-rarely  Vaping Use  . Vaping Use: Never used  Substance and Sexual Activity  . Alcohol use: Yes    Alcohol/week: 0.0 - 2.0 standard drinks  . Drug use: No  . Sexual activity: Not Currently     Partners: Male    Birth control/protection: Abstinence  Other Topics Concern  . Not on file  Social History Narrative  . Not on file   Social Determinants of Health   Financial Resource Strain:   . Difficulty of Paying Living Expenses: Not on file  Food Insecurity:   . Worried About Programme researcher, broadcasting/film/video in the Last Year: Not on file  . Ran Out of Food in the Last Year: Not on file  Transportation Needs:   . Lack of Transportation (Medical): Not on file  . Lack of Transportation (Non-Medical): Not on file  Physical Activity:   . Days of Exercise per Week: Not on file  . Minutes of Exercise per Session: Not on file  Stress:   . Feeling of Stress : Not on file  Social Connections:   . Frequency of Communication with Friends and Family: Not on file  . Frequency of Social Gatherings with Friends and Family: Not on file  . Attends Religious Services: Not on file  . Active Member of Clubs or Organizations: Not on file  . Attends Banker Meetings: Not on file  . Marital Status: Not on file  Intimate Partner Violence:   . Fear of Current or Ex-Partner: Not on file  . Emotionally Abused: Not on file  . Physically Abused: Not on file  . Sexually Abused: Not on file    ROS see above  PHYSICAL EXAMINATION:    BP 110/70   Pulse 84   Temp 98.2 F (36.8 C)   Ht 5\' 7"  (1.702 m)   Wt 192 lb 6.4 oz (87.3 kg)   LMP 01/28/2020   SpO2 99%   BMI 30.13 kg/m     General appearance: alert, cooperative and appears stated age Abdomen: soft, non-tender; non distended, no masses,  no organomegaly Tender, mildly swollen inguinal lymph nodes bilaterally.   Pelvic: External genitalia:  Ulcer just under the clitoral hood.              Urethra:  normal appearing urethra with no masses, tenderness or lesions              Bartholins and Skenes: normal                 Vagina: normal appearing vagina with normal color and discharge, no lesions              Cervix: no cervical motion  tenderness and no lesions              Bimanual Exam:  Uterus:  normal size, contour, position, consistency, mobility, non-tender              Adnexa: no mass, fullness, tenderness               Chaperone was present for exam.  ASSESSMENT Bilateral tender, swollen inguinal lymph nodes New sexual partner H/O Herpes, wasn't aware she had an ulcer but it was very tender to touch    PLAN Full STD testing, CBC with diff Will send HSV PCR test of vulvar ulcer  She has valtrex at home, she will take 2 tablets (total 1,000 mg) 2 x a day until her results are back Ibuprofen 800 mg q 8 hours prn

## 2020-01-31 NOTE — Telephone Encounter (Signed)
AEX 03/2019-DL H/o migraines with aura No contraception  LMP 01/28/20  Spoke with Michelle Ewing. Michelle Ewing states having bilateral swollen, sore/painful to the touch lymph nodes in crease of leg/groin area x 6 days. Michelle Ewing states "size of walnuts". Michelle Ewing states hurts to wear jeans or underwear, has been wearing loose pajama pants. Denies any vaginal discharge, odor, itching, bleeding. Michelle Ewing states was SA x 2 weeks ago.  Denies taking any OTC medications for pain or used any home remedies. Denies fever, chills. Denies any intense exercise, change in routine or any recent sickness.   Advised to have OV for evaluation. Michelle Ewing agreeable. Michelle Ewing scheduled with Dr Oscar La on 11/29 at 430 pm. Michelle Ewing verbalized understanding to date and time of appt.  Encounter closed

## 2020-01-31 NOTE — Telephone Encounter (Signed)
Patient is calling in regards to "swollen lymphnodes in groin area".

## 2020-02-01 LAB — CBC WITH DIFFERENTIAL/PLATELET
Basophils Absolute: 0.1 10*3/uL (ref 0.0–0.2)
Basos: 1 %
EOS (ABSOLUTE): 0.2 10*3/uL (ref 0.0–0.4)
Eos: 3 %
Hematocrit: 39.7 % (ref 34.0–46.6)
Hemoglobin: 13.3 g/dL (ref 11.1–15.9)
Immature Grans (Abs): 0 10*3/uL (ref 0.0–0.1)
Immature Granulocytes: 0 %
Lymphocytes Absolute: 2.5 10*3/uL (ref 0.7–3.1)
Lymphs: 31 %
MCH: 29.4 pg (ref 26.6–33.0)
MCHC: 33.5 g/dL (ref 31.5–35.7)
MCV: 88 fL (ref 79–97)
Monocytes Absolute: 1 10*3/uL — ABNORMAL HIGH (ref 0.1–0.9)
Monocytes: 13 %
Neutrophils Absolute: 4.1 10*3/uL (ref 1.4–7.0)
Neutrophils: 52 %
Platelets: 342 10*3/uL (ref 150–450)
RBC: 4.53 x10E6/uL (ref 3.77–5.28)
RDW: 12.9 % (ref 11.7–15.4)
WBC: 7.9 10*3/uL (ref 3.4–10.8)

## 2020-02-01 LAB — HEP, RPR, HIV PANEL
HIV Screen 4th Generation wRfx: NONREACTIVE
Hepatitis B Surface Ag: NEGATIVE
RPR Ser Ql: NONREACTIVE

## 2020-02-01 LAB — HEPATITIS C ANTIBODY: Hep C Virus Ab: <0.1 s/co ratio (ref 0.0–0.9)

## 2020-02-02 ENCOUNTER — Other Ambulatory Visit: Payer: Self-pay | Admitting: Obstetrics and Gynecology

## 2020-02-02 ENCOUNTER — Telehealth: Payer: Self-pay

## 2020-02-02 DIAGNOSIS — B009 Herpesviral infection, unspecified: Secondary | ICD-10-CM

## 2020-02-02 LAB — CHLAMYDIA/GONOCOCCUS/TRICHOMONAS, NAA
Chlamydia by NAA: NEGATIVE
Gonococcus by NAA: NEGATIVE
Trich vag by NAA: NEGATIVE

## 2020-02-02 NOTE — Telephone Encounter (Signed)
Medication refill request: Valacyclovir 500mg   Last AEX:  04/02/19 Next AEX: 04/04/20 Last MMG (if hormonal medication request): 08/13/19  Neg  Refill authorized: 180/0    Pt request 90 day supply

## 2020-02-02 NOTE — Telephone Encounter (Signed)
Patient would like to discuss test results.

## 2020-02-02 NOTE — Telephone Encounter (Signed)
Left message for patient to call Triage.

## 2020-02-02 NOTE — Telephone Encounter (Signed)
Routing to Dr. Oscar La, patient's provider.

## 2020-02-02 NOTE — Telephone Encounter (Signed)
Patient requesting lab results from 01-31-20. She has reviewed on MyChart but requesting Dr.Silva's recommendations. Advised patient, Dr.Silva sees labs the same time patient does and hadn't had time to review. Will send note to provider and ask her to review. I will let her know she can send patient note directly in MyChart.  Routed to provider

## 2020-02-02 NOTE — Telephone Encounter (Signed)
Her HSV culture is pending. She hasn't been taking valtrex daily, just prn. I advised her to increase her dose to 100 mg BID for 10 days.  I've can a refill for a 10 day course, but I would recommend to wait for the culture results prior to making a longer term plan.  Please see if she is feeling better with the Valtrex.

## 2020-02-03 LAB — HSV NAA
HSV 1 NAA: NEGATIVE
HSV 2 NAA: NEGATIVE

## 2020-02-03 NOTE — Telephone Encounter (Signed)
Routine to Dr. Oscar La, the patient's provider.

## 2020-02-04 ENCOUNTER — Other Ambulatory Visit: Payer: Self-pay

## 2020-02-04 ENCOUNTER — Encounter: Payer: Self-pay | Admitting: Obstetrics and Gynecology

## 2020-02-04 ENCOUNTER — Ambulatory Visit (INDEPENDENT_AMBULATORY_CARE_PROVIDER_SITE_OTHER): Payer: BC Managed Care – PPO | Admitting: Obstetrics and Gynecology

## 2020-02-04 ENCOUNTER — Other Ambulatory Visit: Payer: Self-pay | Admitting: Obstetrics and Gynecology

## 2020-02-04 VITALS — BP 140/92 | HR 90 | Resp 14 | Ht 67.0 in | Wt 192.1 lb

## 2020-02-04 DIAGNOSIS — A55 Chlamydial lymphogranuloma (venereum): Secondary | ICD-10-CM

## 2020-02-04 MED ORDER — DOXYCYCLINE HYCLATE 100 MG PO CAPS: 100.0000 mg | ORAL_CAPSULE | Freq: Two times a day (BID) | ORAL | 0 refills | Status: DC

## 2020-02-04 NOTE — Telephone Encounter (Signed)
Results called to pt by Dr Oscar La. Advised pt to have return OV for more testing and consult.   Call placed to pt. Pt agreeable with OV. Pt scheduled with Dr Oscar La on 12/3 at 315 pm. Pt verbalized understanding to date and time of appt.  Routing to Dr Oscar La for update Encounter closed

## 2020-02-04 NOTE — Progress Notes (Signed)
GYNECOLOGY  VISIT   HPI: 44 y.o.   Single   Black or African American White or Caucasian Not Hispanic or Latino  female   [phone] with Patient's last menstrual period was 01/28/2020.   here to discuss results  The lymph node in the left groin is less tender, still tender on the right. The ulcer doesn't hurt as long as it isn't touched.   GYNECOLOGIC HISTORY: Patient's last menstrual period was 01/28/2020. Contraception: none Menopausal hormone therapy: none        OB History    Gravida  5   Para  2   Term  2   Preterm      AB  3   Living  2     SAB  1   TAB  2   Ectopic      Multiple      Live Births  2              Patient Active Problem List   Diagnosis Date Noted  . Status post total replacement of right hip 05/21/2019  . Unilateral primary osteoarthritis, right hip 04/27/2019  . Follow-up examination after orthopedic surgery 11/05/2016  . Aftercare following left hip joint replacement surgery 11/17/2015  . History of total left hip arthroplasty 11/17/2015  . Status post left hip replacement 10/31/2015  . Migraine 10/18/2015  . Primary osteoarthritis of left hip 09/28/2015    Past Medical History:  Diagnosis Date  . Anxiety 2014  . Arthritis   . ASCUS with positive high risk HPV   . Depression   . Exercise-induced asthma   . Migraine with aura   . Smoker     Past Surgical History:  Procedure Laterality Date  . 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 TOTAL HIP ARTHROPLASTY  left  . TOTAL HIP ARTHROPLASTY Right 05/21/2019   Procedure: RIGHT TOTAL HIP ARTHROPLASTY ANTERIOR APPROACH;  Surgeon: Kathryne Hitch, MD;  Location: [facility];  Service: Orthopedics;  Laterality: Right;    Current Outpatient Medications  Medication Sig Dispense Refill  . Biotin (BIOTIN 5000) 5 MG CAPS Take 5 mg by mouth daily.    . Cholecalciferol (VITAMIN D) 50 MCG (2000 UT) tablet Take 4,000 Units by mouth daily.    . DULoxetine (CYMBALTA) 30 MG  capsule TAKE 1 CAPSULE BY MOUTH EVERY DAY 90 capsule 2  . folic acid (FOLVITE) 400 MCG tablet Take 400 mcg by mouth daily.    Marland Kitchen ibuprofen (ADVIL) 800 MG tablet Take 1 tablet (800 mg total) by mouth every 8 (eight) hours as needed. 30 tablet 1  . Melatonin 10 MG CAPS Take 10 mg by mouth at bedtime as needed (sleep).    . Multiple Vitamins-Minerals (MULTIVITAMIN WITH MINERALS) tablet Take 2 tablets by mouth daily.     . valACYclovir (VALTREX) 500 MG tablet Take one tablet bid at onset of outbreak for 3 days 30 tablet 0   No current facility-administered medications for this visit.     ALLERGIES: Bacitracin, Peroxide [hydrogen peroxide], Benzalkonium chloride, and Neosporin [neomycin-bacitracin zn-polymyx]  Family History  Problem Relation Age of Onset  . Migraines Maternal Grandmother   . Heart disease Maternal Grandmother   . Hodgkin's lymphoma Maternal Grandfather     Social History   Socioeconomic History  . Marital status: Single    Spouse name: Not on file  . Number of children: Not on file  . Years of education: Not on file  .  Highest education level: Not on file  Occupational History  . Not on file  Tobacco Use  . Smoking status: Current Some Day Smoker    Types: Cigarettes  . Smokeless tobacco: Never Used  . Tobacco comment: smokes when has a glass of alcohol-rarely  Vaping Use  . Vaping Use: Never used  Substance and Sexual Activity  . Alcohol use: Yes    Alcohol/week: 0.0 - 2.0 standard drinks  . Drug use: No  . Sexual activity: Not Currently    Partners: Male    Birth control/protection: Abstinence  Other Topics Concern  . Not on file  Social History Narrative  . Not on file   Social Determinants of Health   Financial Resource Strain:   . Difficulty of Paying Living Expenses: Not on file  Food Insecurity:   . Worried About Programme researcher, broadcasting/film/video in the Last Year: Not on file  . Ran Out of Food in the Last Year: Not on file  Transportation Needs:   . Lack  of Transportation (Medical): Not on file  . Lack of Transportation (Non-Medical): Not on file  Physical Activity:   . Days of Exercise per Week: Not on file  . Minutes of Exercise per Session: Not on file  Stress:   . Feeling of Stress : Not on file  Social Connections:   . Frequency of Communication with Friends and Family: Not on file  . Frequency of Social Gatherings with Friends and Family: Not on file  . Attends Religious Services: Not on file  . Active Member of Clubs or Organizations: Not on file  . Attends Banker Meetings: Not on file  . Marital Status: Not on file  Intimate Partner Violence:   . Fear of Current or Ex-Partner: Not on file  . Emotionally Abused: Not on file  . Physically Abused: Not on file  . Sexually Abused: Not on file    Review of Systems  Genitourinary:       Cramping   Musculoskeletal: Positive for back pain.  All other systems reviewed and are negative.   PHYSICAL EXAMINATION:    BP (!) 140/92 (BP Location: Right Arm, Patient Position: Sitting, Cuff Size: Normal)   Pulse 90   Resp 14   Ht 5\' 7"  (1.702 m)   Wt 192 lb 1.6 oz (87.1 kg)   LMP 01/28/2020   BMI 30.09 kg/m     General appearance: alert, cooperative and appears stated age Groin: bilaterally tender lymph nodes, enlarged. Pelvic: External genitalia:  Single tender ulcer just under the clitoris.               Urethra:  normal appearing urethra with no masses, tenderness or lesions              Bartholins and Skenes: normal                  Chaperone was present for exam.  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 I have spoken with ID, they recommend treatment and serology    PLAN Send nuswab from the ulcer for Chlamydia and mycoplasm (earlier this week had negative cervical testing for Chlamydia). Serology for LGV Treat with doxycycline 100 mg po BID x 21 days Partner needs to be seen, tested and treated.    Over 20 minutes in total patient care.

## 2020-02-08 ENCOUNTER — Other Ambulatory Visit: Payer: Self-pay | Admitting: *Deleted

## 2020-02-08 ENCOUNTER — Encounter: Payer: Self-pay | Admitting: Obstetrics and Gynecology

## 2020-02-08 DIAGNOSIS — Z8659 Personal history of other mental and behavioral disorders: Secondary | ICD-10-CM

## 2020-02-08 MED ORDER — DULOXETINE HCL 30 MG PO CPEP: 30.0000 mg | ORAL_CAPSULE | Freq: Every day | ORAL | 0 refills | Status: DC

## 2020-02-08 NOTE — Telephone Encounter (Signed)
Medication refill request: Duloxetine  Last AEX:  04-02-19 DL  Next AEX: 05-08-66 BS Last MMG (if hormonal medication request): n/a Refill authorized: today, please advise.   Medication pended for #90, 0RF. Please refill if appropriate.

## 2020-02-10 ENCOUNTER — Telehealth: Payer: Self-pay

## 2020-02-10 LAB — CT, NG, M GENITALIUM NAA, SWAB
Chlamydia trachomatis, NAA: NEGATIVE
Mycoplasma genitalium NAA: NEGATIVE
Neisseria gonorrhoeae, NAA: NEGATIVE

## 2020-02-10 NOTE — Telephone Encounter (Signed)
Spoke with patient and advised results are not back yet. Patient states she had lab drawn at [facility] and cultures sent to [facility].   Patient states left lymph node in groin area better but lymph node in right groin still very prominent. She's getting anxious that this can be something "serious" and with symptoms going on for 3 weeks now, wants to make sure there's nothing else she should be doing. Advised will check on tests results and discuss with Dr.Jertson and call her back.

## 2020-02-10 NOTE — Telephone Encounter (Signed)
Patient calling for test results. °

## 2020-02-10 NOTE — Telephone Encounter (Signed)
02/04/20 serum results received from [facility]. [facility] swab still pending.  Results to Dr. Oscar La.   Routing to Dr. Oscar La for review.

## 2020-02-11 NOTE — Telephone Encounter (Signed)
Please let the patient know that all of her testing returned as negative. Given her symptoms and recommendations from ID I would continue to treat her. See if her symptoms are improving. Please offer consult with ID if desired.

## 2020-02-11 NOTE — Telephone Encounter (Signed)
Spoke with pt. Pt advised per Dr Oscar La to complete abx course for 3 weeks and then have follow up 1 week past abx use.Pt agreeable. Pt scheduled with Dr Oscar La on 12/29 at 1130 am. Pt verbalized understanding to date and time of appt.  Pt changed AEX from BS to JJ on 04/10/2020. Routing to Dr Oscar La for update  Encounter closed

## 2020-02-11 NOTE — Telephone Encounter (Signed)
Spoke with pt. Pt given update and recommendations per Dr Oscar La. Pt states right side groin lymph node same size, but harder and not as tender. Left side groin lymph node has gone down and not tender. Pt states still taking doxycycline as prescribed BID. Pt declines ID referral consult at this time, states "not worried about infection, just the swollen lymph nodes"   Routing to Dr Oscar La for update, please advise for any further recommendations.

## 2020-02-16 ENCOUNTER — Other Ambulatory Visit: Payer: Self-pay | Admitting: Obstetrics and Gynecology

## 2020-03-01 ENCOUNTER — Encounter: Payer: Self-pay | Admitting: Obstetrics and Gynecology

## 2020-03-01 ENCOUNTER — Other Ambulatory Visit: Payer: Self-pay

## 2020-03-01 ENCOUNTER — Ambulatory Visit (INDEPENDENT_AMBULATORY_CARE_PROVIDER_SITE_OTHER): Payer: BC Managed Care – PPO | Admitting: Obstetrics and Gynecology

## 2020-03-01 VITALS — BP 118/74 | HR 87 | Ht 67.0 in | Wt 191.8 lb

## 2020-03-01 DIAGNOSIS — A55 Chlamydial lymphogranuloma (venereum): Secondary | ICD-10-CM

## 2020-03-01 DIAGNOSIS — R590 Localized enlarged lymph nodes: Secondary | ICD-10-CM | POA: Diagnosis not present

## 2020-03-01 NOTE — Progress Notes (Signed)
GYNECOLOGY  VISIT   HPI: 44 y.o.   Single   Black or African American White or Caucasian Not Hispanic or Latino  female   [phone] with No LMP recorded.   here for f/u. She was treated ~4 weeks ago for suspected lymphogranuloma venereum. She had bilaterally swollen, tender inguinal lymph nodes and a solitary vulvar ulcer. I reviewed the patient with ID and they recommended testing and treatment with doxycycline for 3 weeks.    Testing was all negative, including: GC/CT/Trich, mycoplasma, hsv, RPR, HIV, HepBsAg, Hep C Ab. She had a nuswab for chlamydia from the vulvar ulcer and the cervix. Normal WBC, negative serology.  Patient states that she still has swelling on her right side. It is definitely improved, she still has a firm lump, minimally tender. She thinks the ulcer is gone.   GYNECOLOGIC HISTORY: No LMP recorded. Contraception: none  Menopausal hormone therapy: none         OB History    Gravida  5   Para  2   Term  2   Preterm      AB  3   Living  2     SAB  1   IAB  2   Ectopic      Multiple      Live Births  2              Patient Active Problem List   Diagnosis Date Noted  . Status post total replacement of right hip 05/21/2019  . Unilateral primary osteoarthritis, right hip 04/27/2019  . Follow-up examination after orthopedic surgery 11/05/2016  . Aftercare following left hip joint replacement surgery 11/17/2015  . History of total left hip arthroplasty 11/17/2015  . Status post left hip replacement 10/31/2015  . Migraine 10/18/2015  . Primary osteoarthritis of left hip 09/28/2015    Past Medical History:  Diagnosis Date  . Anxiety 2014  . Arthritis   . ASCUS with positive high risk HPV   . Depression   . Exercise-induced asthma   . Migraine with aura   . Smoker     Past Surgical History:  Procedure Laterality Date  . 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 TOTAL HIP ARTHROPLASTY  left  . TOTAL HIP ARTHROPLASTY Right  05/21/2019   Procedure: RIGHT TOTAL HIP ARTHROPLASTY ANTERIOR APPROACH;  Surgeon: Kathryne Hitch, MD;  Location: [facility];  Service: Orthopedics;  Laterality: Right;    Current Outpatient Medications  Medication Sig Dispense Refill  . Biotin (BIOTIN 5000) 5 MG CAPS Take 5 mg by mouth daily.    . Cholecalciferol (VITAMIN D) 50 MCG (2000 UT) tablet Take 4,000 Units by mouth daily.    Marland Kitchen doxycycline (VIBRAMYCIN) 100 MG capsule Take 1 capsule (100 mg total) by mouth 2 (two) times daily. Take BID for 21 days.  Take with food as can cause GI distress. 42 capsule 0  . DULoxetine (CYMBALTA) 30 MG capsule Take 1 capsule (30 mg total) by mouth daily. 90 capsule 0  . folic acid (FOLVITE) 400 MCG tablet Take 400 mcg by mouth daily.    Marland Kitchen ibuprofen (ADVIL) 800 MG tablet Take 1 tablet (800 mg total) by mouth every 8 (eight) hours as needed. 30 tablet 1  . Melatonin 10 MG CAPS Take 10 mg by mouth at bedtime as needed (sleep).    . Multiple Vitamins-Minerals (MULTIVITAMIN WITH MINERALS) tablet Take 2 tablets by mouth daily.     Marland Kitchen  valACYclovir (VALTREX) 500 MG tablet Take one tablet bid at onset of outbreak for 3 days 30 tablet 0   No current facility-administered medications for this visit.     ALLERGIES: Bacitracin, Peroxide [hydrogen peroxide], Benzalkonium chloride, and Neosporin [neomycin-bacitracin zn-polymyx]  Family History  Problem Relation Age of Onset  . Migraines Maternal Grandmother   . Heart disease Maternal Grandmother   . Hodgkin's lymphoma Maternal Grandfather     Social History   Socioeconomic History  . Marital status: Single    Spouse name: Not on file  . Number of children: Not on file  . Years of education: Not on file  . Highest education level: Not on file  Occupational History  . Not on file  Tobacco Use  . Smoking status: Current Some Day Smoker    Types: Cigarettes  . Smokeless tobacco: Never Used  . Tobacco comment: smokes when has a glass of alcohol-rarely   Vaping Use  . Vaping Use: Never used  Substance and Sexual Activity  . Alcohol use: Yes    Alcohol/week: 0.0 - 2.0 standard drinks  . Drug use: No  . Sexual activity: Not Currently    Partners: Male    Birth control/protection: Abstinence  Other Topics Concern  . Not on file  Social History Narrative  . Not on file   Social Determinants of Health   Financial Resource Strain: Not on file  Food Insecurity: Not on file  Transportation Needs: Not on file  Physical Activity: Not on file  Stress: Not on file  Social Connections: Not on file  Intimate Partner Violence: Not on file    Review of Systems  All other systems reviewed and are negative.   PHYSICAL EXAMINATION:    There were no vitals taken for this visit.    General appearance: alert, cooperative and appears stated age Right inguinal region with one enlarged, not tender lymph node (currently ~ 2 x 1 cm), previously she was so tender it was difficult to accurately access the size. No adenopathy in the left groin.   Pelvic: External genitalia:  no lesions, the ulceration has healed              Urethra:  normal appearing urethra with no masses, tenderness or lesions              Bartholins and Skenes: normal                 Vagina: normal appearing vagina with normal color and discharge, no lesions              Cervix: no cervical motion tenderness and no lesions              Bimanual Exam:  Uterus:  normal size, contour, position, consistency, mobility, non-tender and anteverted              Adnexa: no mass, fullness, tenderness                Chaperone was present for exam.  ASSESSMENT H/O bilaterally tender lymph nodes with vulvar ulcer. She was treated for presumed lymphogranuloma venereum. All of her lab testing was negative. She is feeling fine, the vulvar ulcer has resolved, the left inguinal adenopathy has resolved. She still has an enlarged, but not tender lymph node on the right.     PLAN I reviewed  with her that my reading on LV is that her lymph nodes should have improved, but don't necessarily have to have  resolved after her 3 week course of doxycycline. She is no longer tender. Recommended f/u in ~1 month, she has an annual exam scheduled around that time. She is worried if the enlarged lymph node could be something else. I told her that given her clinical scenario and improvement with antibiotics that it is c/w an infectious etiology.   For further reassurance, I will run it by Dr Gwynn Burly in ID. Staff message sent.     Over 20 minutes in total patient care

## 2020-03-02 ENCOUNTER — Telehealth: Payer: Self-pay | Admitting: Obstetrics and Gynecology

## 2020-03-02 NOTE — Telephone Encounter (Signed)
-----   Message from Kathlynn Grate, DO sent at 03/01/2020  4:48 PM EST ----- Regarding: RE: [name],  Thanks for the update. Interesting case and I agree that the adenopathy should be improving, which it seems like it is, but might not be completely resolved. I agree with following it up in a month to ensure that continues to be the case but don't think I'd do anything differently since overall she's gotten better. It's interesting that all her chlamydia testing via NAAT and serologies were negative (I see the NAATs in epic but not the serology).   The other thought I had was could her syphilis serology been falsely negative because it was early? When you see her next month you could repeat that (and probably an HIV) to be sure, but either way the doxycycline would've adequately treated syphilis, too.  Otherwise it really sounded like it was LGV.   Thanks, Greig Castilla ----- Message ----- From: Romualdo Bolk, MD Sent: 03/01/2020  12:21 PM EST To: Kathlynn Grate, DO  Betsey Holiday, This is the patient that I treated for lymphogranuloma venereum last month (all testing was negative, including serology that was done through [facility]).  She came in today for f/u and her only residual finding is an enlarged, not tender inguinal node on one side. Previously she was so tender it was hard to access the size of the node. Her vulvar ulcer has resolved. My understanding is that the adenopathy should be improved, but not necessarily gone after 3 week treatment with doxycycline. She is very anxious that the node could be from something else. I recommended we f/u in a month and confirm it is continuing to resolve, she asked that I run it by you since you have experience with LV.  Thank you so much for your help and sorry for the inconvenience. Michelle Ewing

## 2020-03-02 NOTE — Telephone Encounter (Signed)
mychart message sent

## 2020-04-03 ENCOUNTER — Ambulatory Visit: Payer: BC Managed Care – PPO | Admitting: Certified Nurse Midwife

## 2020-04-04 ENCOUNTER — Ambulatory Visit: Payer: BC Managed Care – PPO | Admitting: Obstetrics and Gynecology

## 2020-04-10 ENCOUNTER — Ambulatory Visit: Payer: BC Managed Care – PPO | Admitting: Obstetrics and Gynecology

## 2020-04-10 NOTE — Progress Notes (Deleted)
45 y.o. [id number] Single   Black or African American White or Caucasian Not Hispanic or Latino female here for annual exam.      No LMP recorded.          Sexually active: {yes no:314532}  The current method of family planning is abstinence.    Exercising: {yes no:314532}  {types:19826} Smoker:  yes  Health Maintenance: Pap:  04-02-19 negative History of abnormal Pap:  {YES NO:22349} MMG:  08-13-19 density C/birads 1 negative BMD:   *** Colonoscopy: *** TDaP:  04-04-10 Gardasil: ***   reports that she has been smoking cigarettes. She has never used smokeless tobacco. She reports current alcohol use. She reports that she does not use drugs.  Past Medical History:  Diagnosis Date  . Anxiety 2014  . Arthritis   . ASCUS with positive high risk HPV   . Depression   . Exercise-induced asthma   . Migraine with aura   . Smoker     Past Surgical History:  Procedure Laterality Date  . 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 TOTAL HIP ARTHROPLASTY  left  . TOTAL HIP ARTHROPLASTY Right 05/21/2019   Procedure: RIGHT TOTAL HIP ARTHROPLASTY ANTERIOR APPROACH;  Surgeon: Kathryne Hitch, MD;  Location: [facility];  Service: Orthopedics;  Laterality: Right;    Current Outpatient Medications  Medication Sig Dispense Refill  . Biotin (BIOTIN 5000) 5 MG CAPS Take 5 mg by mouth daily.    . Cholecalciferol (VITAMIN D) 50 MCG (2000 UT) tablet Take 4,000 Units by mouth daily.    Marland Kitchen doxycycline (VIBRAMYCIN) 100 MG capsule Take 1 capsule (100 mg total) by mouth 2 (two) times daily. Take BID for 21 days.  Take with food as can cause GI distress. 42 capsule 0  . DULoxetine (CYMBALTA) 30 MG capsule Take 1 capsule (30 mg total) by mouth daily. 90 capsule 0  . folic acid (FOLVITE) 400 MCG tablet Take 400 mcg by mouth daily.    Marland Kitchen ibuprofen (ADVIL) 800 MG tablet Take 1 tablet (800 mg total) by mouth every 8 (eight) hours as needed. 30 tablet 1  . Melatonin 10 MG CAPS Take 10 mg by mouth at  bedtime as needed (sleep).    . Multiple Vitamins-Minerals (MULTIVITAMIN WITH MINERALS) tablet Take 2 tablets by mouth daily.     . valACYclovir (VALTREX) 500 MG tablet Take one tablet bid at onset of outbreak for 3 days 30 tablet 0   No current facility-administered medications for this visit.    Family History  Problem Relation Age of Onset  . Migraines Maternal Grandmother   . Heart disease Maternal Grandmother   . Hodgkin's lymphoma Maternal Grandfather     Review of Systems  Exam:   There were no vitals taken for this visit.  Weight change: @WEIGHTCHANGE @ Height:      Ht Readings from Last 3 Encounters:  03/01/20 5\' 7"  (1.702 m)  02/04/20 5\' 7"  (1.702 m)  01/31/20 5\' 7"  (1.702 m)    General appearance: alert, cooperative and appears stated age Head: Normocephalic, without obvious abnormality, atraumatic Neck: no adenopathy, supple, symmetrical, trachea midline and thyroid {CHL AMB PHY EX THYROID NORM DEFAULT:[id number]::"normal to inspection and palpation"} Lungs: clear to auscultation bilaterally Cardiovascular: regular rate and rhythm Breasts: {Exam; breast:13139::"normal appearance, no masses or tenderness"} Abdomen: soft, non-tender; non distended,  no masses,  no organomegaly Extremities: extremities normal, atraumatic, no cyanosis or edema Skin: Skin color, texture, turgor normal. No rashes or  lesions Lymph nodes: Cervical, supraclavicular, and axillary nodes normal. No abnormal inguinal nodes palpated Neurologic: Grossly normal   Pelvic: External genitalia:  no lesions              Urethra:  normal appearing urethra with no masses, tenderness or lesions              Bartholins and Skenes: normal                 Vagina: normal appearing vagina with normal color and discharge, no lesions              Cervix: {CHL AMB PHY EX CERVIX NORM [phone]::"no lesions"}               Bimanual Exam:  Uterus:  {CHL AMB PHY EX UTERUS NORM [phone]::"normal  size, contour, position, consistency, mobility, non-tender"}              Adnexa: {CHL AMB PHY EX ADNEXA NO MASS [phone]::"no mass, fullness, tenderness"}               Rectovaginal: Confirms               Anus:  normal sphincter tone, no lesions  *** chaperoned for the exam.  A:  Well Woman with normal exam  P:

## 2020-05-06 ENCOUNTER — Other Ambulatory Visit: Payer: Self-pay | Admitting: Obstetrics and Gynecology

## 2020-05-06 DIAGNOSIS — Z8659 Personal history of other mental and behavioral disorders: Secondary | ICD-10-CM

## 2020-06-06 ENCOUNTER — Other Ambulatory Visit: Payer: Self-pay | Admitting: Obstetrics and Gynecology

## 2020-06-06 DIAGNOSIS — Z8659 Personal history of other mental and behavioral disorders: Secondary | ICD-10-CM

## 2020-06-06 NOTE — Telephone Encounter (Signed)
Please let the patient know that I sent in a 3 month script for cymbalta and have her schedule at annual exam.

## 2020-06-06 NOTE — Telephone Encounter (Signed)
Medication refill request: Cymbalta Last AEX:  04-02-19 JJ  Next AEX: not scheduled  Last MMG (if hormonal medication request): n/a Refill authorized: Today, please advise.   Medication pended for #30, 0RF. Please refill if appropriate.

## 2020-06-06 NOTE — Telephone Encounter (Signed)
Detailed message left with note as seen below from Dr. Oscar La. Advised patient to return call to office to schedule aex.   Encounter closed.

## 2020-08-14 ENCOUNTER — Emergency Department (HOSPITAL_BASED_OUTPATIENT_CLINIC_OR_DEPARTMENT_OTHER): Payer: BC Managed Care – PPO | Admitting: Radiology

## 2020-08-14 ENCOUNTER — Encounter (HOSPITAL_BASED_OUTPATIENT_CLINIC_OR_DEPARTMENT_OTHER): Payer: Self-pay | Admitting: *Deleted

## 2020-08-14 ENCOUNTER — Other Ambulatory Visit: Payer: Self-pay

## 2020-08-14 ENCOUNTER — Emergency Department (HOSPITAL_BASED_OUTPATIENT_CLINIC_OR_DEPARTMENT_OTHER)
Admission: EM | Admit: 2020-08-14 | Discharge: 2020-08-15 | Disposition: A | Discharge status: Home / Self Care | Payer: BC Managed Care – PPO | Attending: Emergency Medicine | Admitting: Emergency Medicine

## 2020-08-14 DIAGNOSIS — R1013 Epigastric pain: Secondary | ICD-10-CM | POA: Diagnosis not present

## 2020-08-14 DIAGNOSIS — R0789 Other chest pain: Secondary | ICD-10-CM | POA: Diagnosis not present

## 2020-08-14 DIAGNOSIS — R110 Nausea: Secondary | ICD-10-CM | POA: Diagnosis not present

## 2020-08-14 DIAGNOSIS — F17210 Nicotine dependence, cigarettes, uncomplicated: Secondary | ICD-10-CM | POA: Diagnosis not present

## 2020-08-14 DIAGNOSIS — Z96643 Presence of artificial hip joint, bilateral: Secondary | ICD-10-CM | POA: Insufficient documentation

## 2020-08-14 DIAGNOSIS — R079 Chest pain, unspecified: Secondary | ICD-10-CM | POA: Diagnosis not present

## 2020-08-14 LAB — BASIC METABOLIC PANEL
Anion gap: 10 (ref 5–15)
BUN: 16 mg/dL (ref 6–20)
CO2: 25 mmol/L (ref 22–32)
Calcium: 9.5 mg/dL (ref 8.9–10.3)
Chloride: 104 mmol/L (ref 98–111)
Creatinine, Ser: 0.82 mg/dL (ref 0.44–1.00)
GFR, Estimated: >60 mL/min (ref >60)
Glucose, Bld: 112 mg/dL — ABNORMAL HIGH (ref 70–99)
Potassium: 4.1 mmol/L (ref 3.5–5.1)
Sodium: 139 mmol/L (ref 135–145)

## 2020-08-14 LAB — CBC
HCT: 39.3 % (ref 36.0–46.0)
Hemoglobin: 13.2 g/dL (ref 12.0–15.0)
MCH: 29.5 pg (ref 26.0–34.0)
MCHC: 33.6 g/dL (ref 30.0–36.0)
MCV: 87.7 fL (ref 80.0–100.0)
Platelets: 342 10*3/uL (ref 150–400)
RBC: 4.48 MIL/uL (ref 3.87–5.11)
RDW: 13.3 % (ref 11.5–15.5)
WBC: 7.5 10*3/uL (ref 4.0–10.5)
nRBC: 0 % (ref 0.0–0.2)

## 2020-08-14 LAB — TROPONIN I (HIGH SENSITIVITY): Troponin I (High Sensitivity): <2 ng/L (ref <18)

## 2020-08-14 NOTE — ED Triage Notes (Signed)
Pt states onset of chest "pressure" and upper back "soreness" tonight, onset while sitting watching TV. Has had some nausea.

## 2020-08-15 DIAGNOSIS — R1013 Epigastric pain: Secondary | ICD-10-CM | POA: Diagnosis not present

## 2020-08-15 LAB — HEPATIC FUNCTION PANEL
ALT: 11 U/L (ref 0–44)
AST: 17 U/L (ref 15–41)
Albumin: 3.9 g/dL (ref 3.5–5.0)
Alkaline Phosphatase: 60 U/L (ref 38–126)
Bilirubin, Direct: <0.1 mg/dL (ref 0.0–0.2)
Total Bilirubin: 0.2 mg/dL — ABNORMAL LOW (ref 0.3–1.2)
Total Protein: 7 g/dL (ref 6.5–8.1)

## 2020-08-15 LAB — LIPASE, BLOOD: Lipase: 35 U/L (ref 11–51)

## 2020-08-15 LAB — TROPONIN I (HIGH SENSITIVITY): Troponin I (High Sensitivity): <2 ng/L (ref <18)

## 2020-08-15 LAB — PREGNANCY, URINE: Preg Test, Ur: NEGATIVE

## 2020-08-15 MED ORDER — PANTOPRAZOLE SODIUM 40 MG PO TBEC
40.0000 mg | DELAYED_RELEASE_TABLET | Freq: Once | ORAL | Status: AC
  Administered 2020-08-15: 40 mg via ORAL
  Filled 2020-08-15: qty 1

## 2020-08-15 MED ORDER — OMEPRAZOLE 20 MG PO CPDR: 20.0000 mg | DELAYED_RELEASE_CAPSULE | Freq: Every day | ORAL | 0 refills | Status: DC

## 2020-08-15 MED ORDER — LIDOCAINE VISCOUS HCL 2 % MT SOLN
15.0000 mL | Freq: Once | OROMUCOSAL | Status: AC
  Administered 2020-08-15: 15 mL via ORAL
  Filled 2020-08-15: qty 15

## 2020-08-15 MED ORDER — ALUM & MAG HYDROXIDE-SIMETH 200-200-20 MG/5ML PO SUSP
30.0000 mL | Freq: Once | ORAL | Status: AC
  Administered 2020-08-15: 30 mL via ORAL
  Filled 2020-08-15: qty 30

## 2020-08-15 MED ORDER — ONDANSETRON HCL 4 MG/2ML IJ SOLN
4.0000 mg | Freq: Once | INTRAMUSCULAR | Status: AC
  Administered 2020-08-15: 4 mg via INTRAVENOUS
  Filled 2020-08-15: qty 2

## 2020-08-15 MED ORDER — MORPHINE SULFATE (PF) 4 MG/ML IV SOLN
4.0000 mg | Freq: Once | INTRAVENOUS | Status: AC
  Administered 2020-08-15: 4 mg via INTRAVENOUS
  Filled 2020-08-15: qty 1

## 2020-08-15 NOTE — ED Provider Notes (Signed)
[facility] EMERGENCY DEPT Provider Note   CSN: [id number] Arrival date & time: 08/14/20  2234     History Chief Complaint  Patient presents with   Chest Pain    Michelle Ewing is a 45 y.o. female.  HPI     This is a 45 year old female with a history of depression and smoking who presents with lower chest pain and upper abdominal pain.  Patient reports that she was sitting watching a movie when she had onset of pressure in her lower chest and upper abdomen.  It radiated to her shoulder blades.  She states it was constant and unrelieved with any medications.  She is never had pain like this before.  She reports that she ate a normal dinner.  It is not exertional in nature.  No recent fevers or cough.  No recent illnesses.  Currently she rates her pain at 9 out of 10.  She states she just is very uncomfortable.  Does report recent NSAID use and stressors at home.  No known history of peptic ulcer or gastritis.  Past Medical History:  Diagnosis Date   Anxiety 2014   Arthritis    ASCUS with positive high risk HPV    Depression    Exercise-induced asthma    Migraine with aura    Smoker     Patient Active Problem List   Diagnosis Date Noted   Status post total replacement of right hip 05/21/2019   Unilateral primary osteoarthritis, right hip 04/27/2019   Follow-up examination after orthopedic surgery 11/05/2016   Aftercare following left hip joint replacement surgery 11/17/2015   History of total left hip arthroplasty 11/17/2015   Status post left hip replacement 10/31/2015   Migraine 10/18/2015   Primary osteoarthritis of left hip 09/28/2015    Past Surgical History:  Procedure Laterality Date   COLPOSCOPY  11/28/10   With hx CIN , 2014 LGSIL   TOTAL HIP ARTHROPLASTY  left   TOTAL HIP ARTHROPLASTY Right 05/21/2019   Procedure: RIGHT TOTAL HIP ARTHROPLASTY ANTERIOR APPROACH;  Surgeon: Kathryne Hitch, MD;  Location: [facility];  Service: Orthopedics;   Laterality: Right;     OB History     Gravida  5   Para  2   Term  2   Preterm      AB  3   Living  2      SAB  1   IAB  2   Ectopic      Multiple      Live Births  2           Family History  Problem Relation Age of Onset   Migraines Maternal Grandmother    Heart disease Maternal Grandmother    Hodgkin's lymphoma Maternal Grandfather     Social History   Tobacco Use   Smoking status: Some Days    Pack years: 0.00    Types: Cigarettes   Smokeless tobacco: Never   Tobacco comments:    smokes when has a glass of alcohol-rarely  Vaping Use   Vaping Use: Never used  Substance Use Topics   Alcohol use: Yes    Alcohol/week: 0.0 - 2.0 standard drinks   Drug use: No    Home Medications Prior to Admission medications   Medication Sig Start Date End Date Taking? Authorizing Provider  omeprazole (PRILOSEC) 20 MG capsule Take 1 capsule (20 mg total) by mouth daily. 08/15/20  Yes Aki Abalos, Mayer Masker, MD  Biotin (BIOTIN 5000) 5 MG  CAPS Take 5 mg by mouth daily.    [provider]  Cholecalciferol (VITAMIN D) 50 MCG (2000 UT) tablet Take 4,000 Units by mouth daily.    [provider]  doxycycline (VIBRAMYCIN) 100 MG capsule Take 1 capsule (100 mg total) by mouth 2 (two) times daily. Take BID for 21 days.  Take with food as can cause GI distress. 02/04/20   Romualdo Bolk, MD  DULoxetine (CYMBALTA) 30 MG capsule TAKE 1 CAPSULE BY MOUTH EVERY DAY 06/06/20   Romualdo Bolk, MD  folic acid (FOLVITE) 400 MCG tablet Take 400 mcg by mouth daily.    [provider]  ibuprofen (ADVIL) 800 MG tablet Take 1 tablet (800 mg total) by mouth every 8 (eight) hours as needed. 01/31/20   Romualdo Bolk, MD  Melatonin 10 MG CAPS Take 10 mg by mouth at bedtime as needed (sleep).    [provider]  Multiple Vitamins-Minerals (MULTIVITAMIN WITH MINERALS) tablet Take 2 tablets by mouth daily.     [provider]  valACYclovir  (VALTREX) 500 MG tablet Take one tablet bid at onset of outbreak for 3 days 01/25/20   Patton Salles, MD    Allergies    Bacitracin, Peroxide Greig Castilla peroxide], Benzalkonium chloride, and Neosporin [neomycin-bacitracin zn-polymyx]  Review of Systems   Review of Systems  Constitutional:  Negative for fever.  Respiratory:  Negative for shortness of breath.   Cardiovascular:  Positive for chest pain.  Gastrointestinal:  Positive for abdominal pain and nausea. Negative for constipation, diarrhea and vomiting.  All other systems reviewed and are negative.  Physical Exam Updated Vital Signs BP 125/81 (BP Location: Right Arm)   Pulse 72   Temp 98.9 F (37.2 C) (Oral)   Resp 13   Ht 1.702 m (5\' 7" )   Wt 86.2 kg   LMP 08/07/2020   SpO2 98%   BMI 29.76 kg/m   Physical Exam Vitals and nursing note reviewed.  Constitutional:      Appearance: She is well-developed. She is not ill-appearing.  HENT:     Head: Normocephalic and atraumatic.  Eyes:     Pupils: Pupils are equal, round, and reactive to light.  Cardiovascular:     Rate and Rhythm: Normal rate and regular rhythm.     Heart sounds: Normal heart sounds.  Pulmonary:     Effort: Pulmonary effort is normal. No respiratory distress.     Breath sounds: No wheezing.  Abdominal:     General: Bowel sounds are normal. There is no abdominal bruit.     Palpations: Abdomen is soft. There is no mass.     Tenderness: There is no abdominal tenderness.  Musculoskeletal:     Cervical back: Neck supple.     Right lower leg: No tenderness.     Left lower leg: No tenderness. No edema.  Skin:    General: Skin is warm and dry.  Neurological:     Mental Status: She is alert and oriented to person, place, and time.  Psychiatric:        Mood and Affect: Mood normal.    ED Results / Procedures / Treatments   Labs (all labs ordered are listed, but only abnormal results are displayed) Labs Reviewed  BASIC METABOLIC PANEL -  Abnormal; Notable for the following components:      Result Value   Glucose, Bld 112 (*)    All other components within normal limits  HEPATIC FUNCTION PANEL -  Abnormal; Notable for the following components:   Total Bilirubin 0.2 (*)    All other components within normal limits  CBC  PREGNANCY, URINE  LIPASE, BLOOD  TROPONIN I (HIGH SENSITIVITY)  TROPONIN I (HIGH SENSITIVITY)    EKG EKG Interpretation  Date/Time:  Monday August 14 2020 22:48:42 EDT Ventricular Rate:  74 PR Interval:  152 QRS Duration: 76 QT Interval:  382 QTC Calculation: 424 R Axis:   77 Text Interpretation: Normal sinus rhythm Normal ECG Confirmed by Ross Marcus ([id number]) on 08/15/2020 1:41:00 AM  Radiology DG Chest 2 View  Result Date: 08/14/2020 CLINICAL DATA:  Chest pain and back pain.  Nausea. EXAM: CHEST - 2 VIEW COMPARISON:  None. FINDINGS: The heart size and mediastinal contours are within normal limits. Both lungs are clear. The visualized skeletal structures are unremarkable. IMPRESSION: No active cardiopulmonary disease. Electronically Signed   By: Burman Nieves M.D.   On: 08/14/2020 23:17    Procedures Procedures   Medications Ordered in ED Medications  pantoprazole (PROTONIX) EC tablet 40 mg (has no administration in time range)  morphine 4 MG/ML injection 4 mg (4 mg Intravenous Given 08/15/20 0210)  ondansetron (ZOFRAN) injection 4 mg (4 mg Intravenous Given 08/15/20 0209)  alum & mag hydroxide-simeth (MAALOX/MYLANTA) 200-200-20 MG/5ML suspension 30 mL (30 mLs Oral Given 08/15/20 0214)    And  lidocaine (XYLOCAINE) 2 % viscous mouth solution 15 mL (15 mLs Oral Given 08/15/20 0214)    ED Course  I have reviewed the triage vital signs and the nursing notes.  Pertinent labs & imaging results that were available during my care of the patient were reviewed by me and considered in my medical decision making (see chart for details).  Clinical Course as of 08/15/20 0403  Tue Aug 15, 2020  0402  Significantly improved after GI cocktail and morphine.  Her work-up is largely reassuring.  Given recent stress and NSAID use, suspect ulcer versus gastritis.  Will treat as such with PPI. [CH]    Clinical Course User Index [CH] Lyal Husted, Mayer Masker, MD   MDM Rules/Calculators/A&P                          Patient presents with epigastric and lower chest pain.  She is overall nontoxic and vital signs are reassuring.  EKG without acute ischemic changes or arrhythmia.  Chest x-ray without pneumothorax or pneumonia.  Aortic contours are normal.  Doubt aortic pathology such as dissection.  Her history is highly suggestive of GI etiology and she is low risk for ACS.  Troponin x2 negative.  LFTs and lipase are normal.  Patient significantly improved with GI cocktail but refused.  Discussed discontinuing anti-inflammatories and starting on a PPI.  Patient is agreeable to plan.  Will follow up with her primary physician.  May ultimately need outpatient endoscopy if her symptoms do not improve.  After history, exam, and medical workup I feel the patient has been appropriately medically screened and is safe for discharge home. Pertinent diagnoses were discussed with the patient. Patient was given return precautions.   Final Clinical Impression(s) / ED Diagnoses Final diagnoses:  Epigastric pain    Rx / DC Orders ED Discharge Orders          Ordered    omeprazole (PRILOSEC) 20 MG capsule  Daily        08/15/20 0402             Augustino Savastano, Mayer Masker, MD  08/15/20 0404  

## 2020-08-15 NOTE — ED Notes (Signed)
ED Provider at bedside. 

## 2020-08-15 NOTE — Discharge Instructions (Addendum)
You were seen today for epigastric pain.  Your work-up is largely reassuring.  This could be related to gastritis or peptic ulcer.  Avoid anti-inflammatory such as ibuprofen.  Start omeprazole daily.

## 2020-09-10 ENCOUNTER — Other Ambulatory Visit: Payer: Self-pay | Admitting: Obstetrics and Gynecology

## 2020-09-10 DIAGNOSIS — Z8659 Personal history of other mental and behavioral disorders: Secondary | ICD-10-CM

## 2020-09-12 ENCOUNTER — Telehealth: Payer: Self-pay

## 2020-09-12 DIAGNOSIS — Z8659 Personal history of other mental and behavioral disorders: Secondary | ICD-10-CM

## 2020-09-12 NOTE — Telephone Encounter (Addendum)
Patient called request refill on generic Cymbalta stating she only has 4 pills left.  Her AEX was due in Jan as her last AEX was 04/02/19 with Leota Sauers, CNM.  I called patient and explained she needs to schedule AEX. She is agreeable and  a message was sent to the appointment desk sched pool to call her and arrange and let me know the date so I can check on refill for her.  AEX now scheduled for 11/08/20 with Dr. Oscar La.

## 2020-09-14 MED ORDER — DULOXETINE HCL 30 MG PO CPEP: ORAL_CAPSULE | ORAL | 0 refills | Status: DC

## 2020-09-14 NOTE — Telephone Encounter (Signed)
Rx sent. Patient informed. 

## 2020-11-05 ENCOUNTER — Other Ambulatory Visit: Payer: Self-pay | Admitting: Obstetrics and Gynecology

## 2020-11-05 DIAGNOSIS — Z8659 Personal history of other mental and behavioral disorders: Secondary | ICD-10-CM

## 2020-11-08 ENCOUNTER — Other Ambulatory Visit: Payer: Self-pay

## 2020-11-08 ENCOUNTER — Ambulatory Visit (INDEPENDENT_AMBULATORY_CARE_PROVIDER_SITE_OTHER): Payer: BC Managed Care – PPO | Admitting: Obstetrics and Gynecology

## 2020-11-08 ENCOUNTER — Encounter: Payer: Self-pay | Admitting: Obstetrics and Gynecology

## 2020-11-08 VITALS — BP 130/84 | HR 78 | Ht 67.0 in | Wt 202.0 lb

## 2020-11-08 DIAGNOSIS — Z8659 Personal history of other mental and behavioral disorders: Secondary | ICD-10-CM

## 2020-11-08 DIAGNOSIS — B009 Herpesviral infection, unspecified: Secondary | ICD-10-CM

## 2020-11-08 DIAGNOSIS — Z6831 Body mass index (BMI) 31.0-31.9, adult: Secondary | ICD-10-CM | POA: Diagnosis not present

## 2020-11-08 DIAGNOSIS — Z01419 Encounter for gynecological examination (general) (routine) without abnormal findings: Secondary | ICD-10-CM | POA: Diagnosis not present

## 2020-11-08 DIAGNOSIS — Z1211 Encounter for screening for malignant neoplasm of colon: Secondary | ICD-10-CM

## 2020-11-08 DIAGNOSIS — R5383 Other fatigue: Secondary | ICD-10-CM

## 2020-11-08 DIAGNOSIS — R635 Abnormal weight gain: Secondary | ICD-10-CM

## 2020-11-08 MED ORDER — VALACYCLOVIR HCL 500 MG PO TABS: ORAL_TABLET | ORAL | 1 refills | Status: DC

## 2020-11-08 MED ORDER — DULOXETINE HCL 30 MG PO CPEP: ORAL_CAPSULE | ORAL | 3 refills | Status: DC

## 2020-11-08 NOTE — Patient Instructions (Signed)
Counselor: Chele Fleming [phone]  EXERCISE   We recommended that you start or continue a regular exercise program for good health. Physical activity is anything that gets your body moving, some is better than none. The CDC recommends 150 minutes per week of Moderate-Intensity Aerobic Activity and 2 or more days of Muscle Strengthening Activity.  Benefits of exercise are limitless: helps weight loss/weight maintenance, improves mood and energy, helps with depression and anxiety, improves sleep, tones and strengthens muscles, improves balance, improves bone density, protects from chronic conditions such as heart disease, high blood pressure and diabetes and so much more. To learn more visit: https://www.cdc.gov/physicalactivity/index.html  DIET: Good nutrition starts with a healthy diet of fruits, vegetables, whole grains, and lean protein sources. Drink plenty of water for hydration. Minimize empty calories, sodium, sweets. For more information about dietary recommendations visit: https://health.gov/our-work/nutrition-physical-activity/dietary-guidelines and https://www.myplate.gov/  ALCOHOL:  Women should limit their alcohol intake to no more than 7 drinks/beers/glasses of wine (combined, not each!) per week. Moderation of alcohol intake to this level decreases your risk of breast cancer and liver damage.  If you are concerned that you may have a problem, or your friends have told you they are concerned about your drinking, there are many resources to help. A well-known program that is free, effective, and available to all people all over the nation is Alcoholics Anonymous.  Check out this site to learn more: https://www.aa.org/   CALCIUM AND VITAMIN D:  Adequate intake of calcium and Vitamin D are recommended for bone health.  You should be getting between 1000-1200 mg of calcium and 800 units of Vitamin D daily between diet and supplements  PAP SMEARS:  Pap smears, to check for cervical cancer  or precancers,  have traditionally been done yearly, scientific advances have shown that most women can have pap smears less often.  However, every woman still should have a physical exam from her gynecologist every year. It will include a breast check, inspection of the vulva and vagina to check for abnormal growths or skin changes, a visual exam of the cervix, and then an exam to evaluate the size and shape of the uterus and ovaries. We will also provide age appropriate advice regarding health maintenance, like when you should have certain vaccines, screening for sexually transmitted diseases, bone density testing, colonoscopy, mammograms, etc.   MAMMOGRAMS:  All women over 40 years old should have a routine mammogram.   COLON CANCER SCREENING: Now recommend starting at age 45. At this time colonoscopy is not covered for routine screening until 50. There are take home tests that can be done between 45-49.   COLONOSCOPY:  Colonoscopy to screen for colon cancer is recommended for all women at age 50.  We know, you hate the idea of the prep.  We agree, BUT, having colon cancer and not knowing it is worse!!  Colon cancer so often starts as a polyp that can be seen and removed at colonscopy, which can quite literally save your life!  And if your first colonoscopy is normal and you have no family history of colon cancer, most women don't have to have it again for 10 years.  Once every ten years, you can do something that may end up saving your life, right?  We will be happy to help you get it scheduled when you are ready.  Be sure to check your insurance coverage so you understand how much it will cost.  It may be covered as a preventative service at no   cost, but you should check your particular policy.      Breast Self-Awareness Breast self-awareness means being familiar with how your breasts look and feel. It involves checking your breasts regularly and reporting any changes to your health care  provider. Practicing breast self-awareness is important. A change in your breasts can be a sign of a serious medical problem. Being familiar with how your breasts look and feel allows you to find any problems early, when treatment is more likely to be successful. All women should practice breast self-awareness, including women who have had breast implants. How to do a breast self-exam One way to learn what is normal for your breasts and whether your breasts are changing is to do a breast self-exam. To do a breast self-exam: Look for Changes  Remove all the clothing above your waist. Stand in front of a mirror in a room with good lighting. Put your hands on your hips. Push your hands firmly downward. Compare your breasts in the mirror. Look for differences between them (asymmetry), such as: Differences in shape. Differences in size. Puckers, dips, and bumps in one breast and not the other. Look at each breast for changes in your skin, such as: Redness. Scaly areas. Look for changes in your nipples, such as: Discharge. Bleeding. Dimpling. Redness. A change in position. Feel for Changes Carefully feel your breasts for lumps and changes. It is best to do this while lying on your back on the floor and again while sitting or standing in the shower or tub with soapy water on your skin. Feel each breast in the following way: Place the arm on the side of the breast you are examining above your head. Feel your breast with the other hand. Start in the nipple area and make  inch (2 cm) overlapping circles to feel your breast. Use the pads of your three middle fingers to do this. Apply light pressure, then medium pressure, then firm pressure. The light pressure will allow you to feel the tissue closest to the skin. The medium pressure will allow you to feel the tissue that is a little deeper. The firm pressure will allow you to feel the tissue close to the ribs. Continue the overlapping circles,  moving downward over the breast until you feel your ribs below your breast. Move one finger-width toward the center of the body. Continue to use the  inch (2 cm) overlapping circles to feel your breast as you move slowly up toward your collarbone. Continue the up and down exam using all three pressures until you reach your armpit.  Write Down What You Find  Write down what is normal for each breast and any changes that you find. Keep a written record with breast changes or normal findings for each breast. By writing this information down, you do not need to depend only on memory for size, tenderness, or location. Write down where you are in your menstrual cycle, if you are still menstruating. If you are having trouble noticing differences in your breasts, do not get discouraged. With time you will become more familiar with the variations in your breasts and more comfortable with the exam. How often should I examine my breasts? Examine your breasts every month. If you are breastfeeding, the best time to examine your breasts is after a feeding or after using a breast pump. If you menstruate, the best time to examine your breasts is 5-7 days after your period is over. During your period, your breasts are lumpier,   and it may be more difficult to notice changes. When should I see my health care provider? See your health care provider if you notice: A change in shape or size of your breasts or nipples. A change in the skin of your breast or nipples, such as a reddened or scaly area. Unusual discharge from your nipples. A lump or thick area that was not there before. Pain in your breasts. Anything that concerns you.  

## 2020-11-08 NOTE — Progress Notes (Signed)
45 y.o. [id number] Single   Black or African American White or Caucasian Not Hispanic or Latino female here for annual exam.  Patient would like to talk about what menopause and her weight gain. Not currently sexually active.  Period Cycle (Days): 28 Period Duration (Days): 4 Period Pattern: Regular Menstrual Flow: Moderate Menstrual Control: Tampon Menstrual Control Change Freq (Hours): 4 Dysmenorrhea: None Dysmenorrhea Symptoms: Headache nausea   She sweats a lot at night. Feels hotter overall during the day.   She has gained weight without explanation. No change in diet or exercise. Up 15-20 lbs since 12/21. Recently changes her diet, trying to eat healthier.   C/O fatigue.   Feels her depression/anxiety is mostly controlled. A little more emotional recently, situational.   H/O HSV 2, + serology in 9/14, infrequent outbreaks.   Patient's last menstrual period was 11/03/2020.          Sexually active: Yes.    The current method of family planning is abstinence.    Exercising: Yes.     Walking  Smoker:  no  Health Maintenance: Pap:  04/02/19 WNL Hr HPV Neg 03-13-17 neg HPV HR neg History of abnormal Pap:  yes MMG:  08/17/19 density C Bi-rads 1 neg  BMD:   none  Colonoscopy: none  TDaP:  04/04/2010 Gardasil: no   reports that she has been smoking cigarettes. She has never used smokeless tobacco. She reports current alcohol use. She reports that she does not use drugs. Just occasional ETOH. 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 Youngest daughter just went to college at [employer], empty nester now. Older daughter is 27 in [place].   Past Medical History:  Diagnosis Date   Anxiety 2014   Arthritis    ASCUS with positive high risk HPV    Depression    Exercise-induced asthma    Migraine with aura    Smoker     Past Surgical History:  Procedure Laterality Date   COLPOSCOPY  11/28/10   With hx CIN , 2014 LGSIL   TOTAL HIP ARTHROPLASTY  left   TOTAL HIP ARTHROPLASTY Right 05/21/2019    Procedure: RIGHT TOTAL HIP ARTHROPLASTY ANTERIOR APPROACH;  Surgeon: Kathryne Hitch, MD;  Location: [facility];  Service: Orthopedics;  Laterality: Right;    Current Outpatient Medications  Medication Sig Dispense Refill   Biotin 5 MG CAPS Take 5 mg by mouth daily.     Cholecalciferol (VITAMIN D) 50 MCG (2000 UT) tablet Take 4,000 Units by mouth daily.     DULoxetine (CYMBALTA) 30 MG capsule TAKE 1 CAPSULE BY MOUTH EVERY DAY 60 capsule 0   folic acid (FOLVITE) 400 MCG tablet Take 400 mcg by mouth daily.     ibuprofen (ADVIL) 800 MG tablet Take 1 tablet (800 mg total) by mouth every 8 (eight) hours as needed. 30 tablet 1   Multiple Vitamins-Minerals (MULTIVITAMIN WITH MINERALS) tablet Take 2 tablets by mouth daily.      valACYclovir (VALTREX) 500 MG tablet Take one tablet bid at onset of outbreak for 3 days 30 tablet 0   No current facility-administered medications for this visit.    Family History  Problem Relation Age of Onset   Migraines Maternal Grandmother    Heart disease Maternal Grandmother    Hodgkin's lymphoma Maternal Grandfather     Review of Systems  All other systems reviewed and are negative.  Exam:   BP 130/84   Pulse 78   Ht 5\' 7"  (1.702 m)   Wt  202 lb (91.6 kg)   LMP 11/03/2020   SpO2 98%   BMI 31.64 kg/m   Weight change: @WEIGHTCHANGE @ Height:   Height: 5\' 7"  (170.2 cm)  Ht Readings from Last 3 Encounters:  11/08/20 5\' 7"  (1.702 m)  08/14/20 5\' 7"  (1.702 m)  03/01/20 5\' 7"  (1.702 m)    General appearance: alert, cooperative and appears stated age Head: Normocephalic, without obvious abnormality, atraumatic Neck: no adenopathy, supple, symmetrical, trachea midline and thyroid normal to inspection and palpation Lungs: clear to auscultation bilaterally Cardiovascular: regular rate and rhythm Breasts: normal appearance, no masses or tenderness Abdomen: soft, non-tender; non distended,  no masses,  no organomegaly Extremities: extremities normal,  atraumatic, no cyanosis or edema Skin: Skin color, texture, turgor normal. No rashes or lesions Lymph nodes: Cervical, supraclavicular, and axillary nodes normal. No abnormal inguinal nodes palpated Neurologic: Grossly normal   Pelvic: External genitalia:  no lesions              Urethra:  normal appearing urethra with no masses, tenderness or lesions              Bartholins and Skenes: normal                 Vagina: normal appearing vagina with normal color and discharge, no lesions              Cervix: no lesions               Bimanual Exam:  Uterus:  normal size, contour, position, consistency, mobility, non-tender              Adnexa: no mass, fullness, tenderness               Rectovaginal: Confirms               Anus:  normal sphincter tone, no lesions  chaperoned for the exam.  1. Well woman exam Discussed breast self exam Discussed calcium and vit D intake Mammogram due, she will schedule  2. Weight gain  - Hemoglobin A1c  3. Other fatigue  - TSH  4. BMI 31.0-31.9,adult  - Hemoglobin A1c - TSH  5. Herpes Infrequent outbreaks - valACYclovir (VALTREX) 500 MG tablet; Take one tablet bid at onset of outbreak for 3 days  Dispense: 30 tablet; Refill: 1  6. History of depression Mostly controlled on cymbalta, some situational stress Name of counselor given - DULoxetine (CYMBALTA) 30 MG capsule; TAKE 1 CAPSULE BY MOUTH EVERY DAY  Dispense: 90 capsule; Refill: 3  7. Colon cancer screening Reviewed options.  - Ambulatory referral to Gastroenterology

## 2020-11-09 LAB — HEMOGLOBIN A1C
Hgb A1c MFr Bld: 5.4 % of total Hgb (ref <5.7)
Mean Plasma Glucose: 108 mg/dL
eAG (mmol/L): 6 mmol/L

## 2020-11-09 LAB — TSH: TSH: 1.26 mIU/L

## 2020-11-15 ENCOUNTER — Other Ambulatory Visit: Payer: Self-pay | Admitting: Obstetrics and Gynecology

## 2020-11-15 ENCOUNTER — Other Ambulatory Visit: Payer: Self-pay

## 2020-11-15 DIAGNOSIS — B009 Herpesviral infection, unspecified: Secondary | ICD-10-CM

## 2021-01-12 ENCOUNTER — Ambulatory Visit (INDEPENDENT_AMBULATORY_CARE_PROVIDER_SITE_OTHER): Payer: BC Managed Care – PPO | Admitting: Orthopaedic Surgery

## 2021-01-12 ENCOUNTER — Ambulatory Visit (HOSPITAL_BASED_OUTPATIENT_CLINIC_OR_DEPARTMENT_OTHER)
Admission: RE | Admit: 2021-01-12 | Discharge: 2021-01-12 | Disposition: A | Discharge status: Home / Self Care | Payer: BC Managed Care – PPO | Source: Ambulatory Visit | Attending: Orthopaedic Surgery | Admitting: Orthopaedic Surgery

## 2021-01-12 ENCOUNTER — Other Ambulatory Visit (HOSPITAL_BASED_OUTPATIENT_CLINIC_OR_DEPARTMENT_OTHER): Payer: Self-pay | Admitting: Orthopaedic Surgery

## 2021-01-12 ENCOUNTER — Other Ambulatory Visit: Payer: Self-pay

## 2021-01-12 DIAGNOSIS — M25511 Pain in right shoulder: Secondary | ICD-10-CM | POA: Diagnosis not present

## 2021-01-12 DIAGNOSIS — M6520 Calcific tendinitis, unspecified site: Secondary | ICD-10-CM | POA: Diagnosis not present

## 2021-01-12 DIAGNOSIS — M25512 Pain in left shoulder: Secondary | ICD-10-CM

## 2021-01-12 DIAGNOSIS — Z043 Encounter for examination and observation following other accident: Secondary | ICD-10-CM | POA: Diagnosis not present

## 2021-01-12 MED ORDER — LIDOCAINE HCL 1 % IJ SOLN
4.0000 mL | INTRAMUSCULAR | Status: AC | PRN
  Administered 2021-01-12: 4 mL

## 2021-01-12 MED ORDER — TRIAMCINOLONE ACETONIDE 40 MG/ML IJ SUSP
80.0000 mg | INTRAMUSCULAR | Status: AC | PRN
  Administered 2021-01-12: 80 mg via INTRA_ARTICULAR

## 2021-01-12 NOTE — Progress Notes (Addendum)
Chief Complaint: left shoulder pain     History of Present Illness:    Michelle Ewing is a 45 y.o. female right-hand-dominant presents today for 2 weeks of left shoulder pain.  She states that she had a direct fall onto the side when she fell off on leaves on her way to work.  The pain is significant about the lateral aspect of the shoulder and is waking her up at night.  She endorses pain about the lateral aspect of this.  She has been taking 800 mg of ibuprofen which helps somewhat.  Pain is worse by end of the day.  She is not had any injections or physical therapy at this time denies previous shoulder pain.    Surgical History:   None  PMH/PSH/Family History/Social History/Meds/Allergies:    Past Medical History:  Diagnosis Date  . Anxiety 2014  . Arthritis   . ASCUS with positive high risk HPV   . Depression   . Exercise-induced asthma   . Migraine with aura   . Smoker    Past Surgical History:  Procedure Laterality Date  . 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 TOTAL HIP ARTHROPLASTY  left  . TOTAL HIP ARTHROPLASTY Right 05/21/2019   Procedure: RIGHT TOTAL HIP ARTHROPLASTY ANTERIOR APPROACH;  Surgeon: Kathryne Hitch, MD;  Location: [facility];  Service: Orthopedics;  Laterality: Right;   Social History   Socioeconomic History  . Marital status: Single    Spouse name: Not on file  . Number of children: Not on file  . Years of education: Not on file  . Highest education level: Not on file  Occupational History  . Not on file  Tobacco Use  . Smoking status: Some Days    Types: Cigarettes  . Smokeless tobacco: Never  . Tobacco comments:    smokes when has a glass of alcohol-rarely  Vaping Use  . Vaping Use: Never used  Substance and Sexual Activity  . Alcohol use: Yes    Alcohol/week: 0.0 - 2.0 standard drinks  . Drug use: No  . Sexual activity: Not Currently    Partners: Male    Birth control/protection:  Abstinence  Other Topics Concern  . Not on file  Social History Narrative  . Not on file   Social Determinants of Health   Financial Resource Strain: Not on file  Food Insecurity: Not on file  Transportation Needs: Not on file  Physical Activity: Not on file  Stress: Not on file  Social Connections: Not on file   Family History  Problem Relation Age of Onset  . Migraines Maternal Grandmother   . Heart disease Maternal Grandmother   . Hodgkin's lymphoma Maternal Grandfather    Allergies  Allergen Reactions  . Bacitracin     welts  . Peroxide [Hydrogen Peroxide]     welt  . Benzalkonium Chloride Rash  . Neosporin [Neomycin-Bacitracin Zn-Polymyx] Rash   Current Outpatient Medications  Medication Sig Dispense Refill  . Biotin 5 MG CAPS Take 5 mg by mouth daily.    . Cholecalciferol (VITAMIN D) 50 MCG (2000 UT) tablet Take 4,000 Units by mouth daily.    . DULoxetine (CYMBALTA) 30 MG capsule TAKE 1 CAPSULE BY MOUTH EVERY DAY 90 capsule 3  . folic acid (FOLVITE) 400  MCG tablet Take 400 mcg by mouth daily.    Marland Kitchen ibuprofen (ADVIL) 800 MG tablet Take 1 tablet (800 mg total) by mouth every 8 (eight) hours as needed. 30 tablet 1  . Multiple Vitamins-Minerals (MULTIVITAMIN WITH MINERALS) tablet Take 2 tablets by mouth daily.     . valACYclovir (VALTREX) 500 MG tablet Take one tablet bid at onset of outbreak for 3 days 30 tablet 1   No current facility-administered medications for this visit.   No results found.  Review of Systems:   A ROS was performed including pertinent positives and negatives as documented in the HPI.  Physical Exam :   Constitutional: NAD and appears stated age Neurological: Alert and oriented Psych: Appropriate affect and cooperative There were no vitals taken for this visit.   Comprehensive Musculoskeletal Exam:    Musculoskeletal Exam    Inspection Right Left  Skin No atrophy or winging No atrophy or winging  Palpation    Tenderness None Greater  tuberosity  Range of Motion    Flexion (passive) 170 170  Flexion (active) 170 170  Abduction 170 170  ER at the side 70 70  Can reach behind back to T12 T12  Strength     Full 4 out of 5 supraspinatus  Special Tests    Pseudoparalytic No No  Neurologic    Fires PIN, radial, median, ulnar, musculocutaneous, axillary, suprascapular, long thoracic, and spinal accessory innervated muscles. No abnormal sensibility  Vascular/Lymphatic    Radial Pulse 2+ 2+  Cervical Exam    Patient has symmetric cervical range of motion with negative Spurling's test.  Special Test:      Imaging:   Xray (3 views left shoulder): Calcific tendinitis deposit within the supraspinatus tendon    I personally reviewed and interpreted the radiographs.   Assessment:   45 year old female with left shoulder calcific tendinitis.  She did have a fall 2 weeks prior at which point this significantly aggravated her pain.  At this time I would like to perform an ultrasound-guided injection with needle barbotage of the calcific tendinitis as well as a Kenalog injection.  I did describe that given the fact that she does have calcific tendinitis along with weakness on exam today, I would like to obtain an MRI to rule out any underlying rotator cuff tear given her acute injury that would potentially need to be intervened upon.  Plan :    -Left shoulder ultrasound-guided Kenalog injection performed -MRI left shoulder to rule out rotator cuff injury  I believe that advance imaging in the form of an MRI is indicated for the following reasons: -Xrays images were obtained and not diagnostic -The patient has failed treatment modalities including activity modification, ice, ibuprofen -The following worrisome symptoms are present on history and exam: Weakness with abduction and forward elevation in the setting of an acute fall/injury      Procedure Note  Patient: Michelle Ewing             Date of Birth: [birth year]            MRN: [id number]             Visit Date: 01/12/2021  Procedures: Visit Diagnoses:  1. Acute pain of left shoulder   2. Calcific tendinitis     Large Joint Inj: L subacromial bursa on 01/12/2021 4:26 PM Indications: pain Details: 22 G 1.5 in needle, ultrasound-guided anterior approach  Arthrogram: No  Medications: 4 mL lidocaine 1 %; 80 mg triamcinolone acetonide  40 MG/ML Outcome: tolerated well, no immediate complications Procedure, treatment alternatives, risks and benefits explained, specific risks discussed. Consent was given by the patient. Immediately prior to procedure a time out was called to verify the correct patient, procedure, equipment, support staff and site/side marked as required. Patient was prepped and draped in the usual sterile fashion.         I personally saw and evaluated the patient, and participated in the management and treatment plan.  Huel Cote, MD Attending Physician, Orthopedic Surgery  This document was dictated using Dragon voice recognition software. A reasonable attempt at proof reading has been made to minimize errors.

## 2021-01-14 ENCOUNTER — Encounter (HOSPITAL_BASED_OUTPATIENT_CLINIC_OR_DEPARTMENT_OTHER): Payer: Self-pay | Admitting: Orthopaedic Surgery

## 2021-01-15 ENCOUNTER — Other Ambulatory Visit (HOSPITAL_BASED_OUTPATIENT_CLINIC_OR_DEPARTMENT_OTHER): Payer: Self-pay | Admitting: Orthopaedic Surgery

## 2021-01-15 ENCOUNTER — Other Ambulatory Visit (HOSPITAL_BASED_OUTPATIENT_CLINIC_OR_DEPARTMENT_OTHER): Payer: Self-pay

## 2021-01-15 MED ORDER — METHYLPREDNISOLONE 4 MG PO TBPK
ORAL_TABLET | ORAL | 0 refills | Status: DC
  Filled 2021-01-15: qty 21, 6d supply, fill #0

## 2021-01-23 ENCOUNTER — Other Ambulatory Visit: Payer: Self-pay | Admitting: Obstetrics and Gynecology

## 2021-01-23 DIAGNOSIS — Z1231 Encounter for screening mammogram for malignant neoplasm of breast: Secondary | ICD-10-CM

## 2021-01-31 ENCOUNTER — Other Ambulatory Visit: Payer: BC Managed Care – PPO

## 2021-02-01 DIAGNOSIS — R140 Abdominal distension (gaseous): Secondary | ICD-10-CM | POA: Diagnosis not present

## 2021-02-01 DIAGNOSIS — K5900 Constipation, unspecified: Secondary | ICD-10-CM | POA: Diagnosis not present

## 2021-02-02 ENCOUNTER — Ambulatory Visit (HOSPITAL_BASED_OUTPATIENT_CLINIC_OR_DEPARTMENT_OTHER): Payer: BC Managed Care – PPO | Admitting: Orthopaedic Surgery

## 2021-02-02 ENCOUNTER — Ambulatory Visit
Admission: RE | Admit: 2021-02-02 | Discharge: 2021-02-02 | Disposition: A | Discharge status: Home / Self Care | Payer: BC Managed Care – PPO | Source: Ambulatory Visit

## 2021-02-02 DIAGNOSIS — Z1231 Encounter for screening mammogram for malignant neoplasm of breast: Secondary | ICD-10-CM | POA: Diagnosis not present

## 2021-02-13 ENCOUNTER — Ambulatory Visit
Admission: RE | Admit: 2021-02-13 | Discharge: 2021-02-13 | Disposition: A | Discharge status: Home / Self Care | Payer: BC Managed Care – PPO | Source: Ambulatory Visit | Attending: Orthopaedic Surgery | Admitting: Orthopaedic Surgery

## 2021-02-13 ENCOUNTER — Other Ambulatory Visit: Payer: Self-pay

## 2021-02-13 DIAGNOSIS — M7532 Calcific tendinitis of left shoulder: Secondary | ICD-10-CM | POA: Diagnosis not present

## 2021-02-13 DIAGNOSIS — S43432A Superior glenoid labrum lesion of left shoulder, initial encounter: Secondary | ICD-10-CM | POA: Diagnosis not present

## 2021-02-13 DIAGNOSIS — M25512 Pain in left shoulder: Secondary | ICD-10-CM

## 2021-02-13 DIAGNOSIS — M19012 Primary osteoarthritis, left shoulder: Secondary | ICD-10-CM | POA: Diagnosis not present

## 2021-02-16 ENCOUNTER — Ambulatory Visit (INDEPENDENT_AMBULATORY_CARE_PROVIDER_SITE_OTHER): Payer: BC Managed Care – PPO | Admitting: Orthopaedic Surgery

## 2021-02-16 ENCOUNTER — Other Ambulatory Visit: Payer: Self-pay

## 2021-02-16 DIAGNOSIS — M259 Joint disorder, unspecified: Secondary | ICD-10-CM | POA: Diagnosis not present

## 2021-02-16 DIAGNOSIS — M6520 Calcific tendinitis, unspecified site: Secondary | ICD-10-CM | POA: Diagnosis not present

## 2021-02-16 NOTE — Progress Notes (Signed)
Chief Complaint: left shoulder pain     History of Present Illness:   02/16/2021: Today overall doing very well.  She did have some pain in the first initial days after the injection but subsequently felt a lot better.  She does endorse some weakness about the shoulder although this is very minor.  She is hoping to get back to a similar lifestyle she had before.  She was previously very active with [employer] and going to the gym.  She is joined the gym and determine we will plan to do this.  She is asking today as she does have several other joint conditions including elbows fingers and knees.  She did have hip replacements done at a very young age.   Michelle Ewing is a 45 y.o. female right-hand-dominant presents today for 2 weeks of left shoulder pain.  She states that she had a direct fall onto the side when she fell off on leaves on her way to work.  The pain is significant about the lateral aspect of the shoulder and is waking her up at night.  She endorses pain about the lateral aspect of this.  She has been taking 800 mg of ibuprofen which helps somewhat.  Pain is worse by end of the day.  She is not had any injections or physical therapy at this time denies previous shoulder pain.    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   TOTAL HIP ARTHROPLASTY  left   TOTAL HIP ARTHROPLASTY Right 05/21/2019   Procedure: RIGHT TOTAL HIP ARTHROPLASTY ANTERIOR APPROACH;  Surgeon: Kathryne Hitch, MD;  Location: [facility];  Service: Orthopedics;  Laterality: Right;   Social History   Socioeconomic History   Marital status: Single     Spouse name: Not on file   Number of children: Not on file   Years of education: Not on file   Highest education level: Not on file  Occupational History   Not on file  Tobacco Use   Smoking status: Some Days    Types: Cigarettes   Smokeless tobacco: Never   Tobacco comments:    smokes when has a glass of alcohol-rarely  Vaping Use   Vaping Use: Never used  Substance and Sexual Activity   Alcohol use: Yes    Alcohol/week: 0.0 - 2.0 standard drinks   Drug use: No   Sexual activity: Not Currently    Partners: Male    Birth control/protection: Abstinence  Other Topics Concern   Not on file  Social History Narrative   Not on file   Social Determinants of Health   Financial Resource Strain: Not on file  Food Insecurity: Not on file  Transportation Needs: Not on file  Physical Activity: Not on file  Stress: Not on file  Social Connections: Not on file   Family History  Problem Relation Age of Onset   Migraines Maternal Grandmother  Heart disease Maternal Grandmother    Hodgkin's lymphoma Maternal Grandfather    Allergies  Allergen Reactions   Bacitracin     welts   Peroxide [Hydrogen Peroxide]     welt   Benzalkonium Chloride Rash   Neosporin [Neomycin-Bacitracin Zn-Polymyx] Rash   Current Outpatient Medications  Medication Sig Dispense Refill   methylPREDNISolone (MEDROL DOSEPAK) 4 MG TBPK tablet Take as directed 21 each 0   Biotin 5 MG CAPS Take 5 mg by mouth daily.     Cholecalciferol (VITAMIN D) 50 MCG (2000 UT) tablet Take 4,000 Units by mouth daily.     DULoxetine (CYMBALTA) 30 MG capsule TAKE 1 CAPSULE BY MOUTH EVERY DAY 90 capsule 3   folic acid (FOLVITE) 400 MCG tablet Take 400 mcg by mouth daily.     ibuprofen (ADVIL) 800 MG tablet Take 1 tablet (800 mg total) by mouth every 8 (eight) hours as needed. 30 tablet 1   Multiple Vitamins-Minerals (MULTIVITAMIN WITH MINERALS) tablet Take 2 tablets by mouth daily.      valACYclovir (VALTREX) 500 MG tablet  Take one tablet bid at onset of outbreak for 3 days 30 tablet 1   No current facility-administered medications for this visit.   No results found.  Review of Systems:   A ROS was performed including pertinent positives and negatives as documented in the HPI.  Physical Exam :   Constitutional: NAD and appears stated age Neurological: Alert and oriented Psych: Appropriate affect and cooperative Last menstrual period 01/28/2021.   Comprehensive Musculoskeletal Exam:    Musculoskeletal Exam    Inspection Right Left  Skin No atrophy or winging No atrophy or winging  Palpation    Tenderness None None  Range of Motion    Flexion (passive) 170 170  Flexion (active) 170 170  Abduction 170 170  ER at the side 70 70  Can reach behind back to T12 T12  Strength     Full 4+ out of 5  Special Tests    Pseudoparalytic No No  Neurologic    Fires PIN, radial, median, ulnar, musculocutaneous, axillary, suprascapular, long thoracic, and spinal accessory innervated muscles. No abnormal sensibility  Vascular/Lymphatic    Radial Pulse 2+ 2+  Cervical Exam    Patient has symmetric cervical range of motion with negative Spurling's test.  Special Test:      Imaging:   Xray (3 views left shoulder): Calcific tendinitis deposit within the supraspinatus tendon  MRI left shoulder: Rotator cuff is intact with evidence of resolution of the calcific tendinitis  I personally reviewed and interpreted the radiographs.   Assessment:   45 year old female with left shoulder calcific tendinitis.  She is status post injection and barbotage with ultrasound.  Overall she is doing much better.  She does have some weakness overhead.  I have specifically advised her on band strengthening exercises which she will perform.  MRI does not show any evidence of rotator cuff injury.  She is also asking about multiple other joints including bilateral elbows fingers and knees.  She does have a mom with a history of  psoriatic arthritis.  As result I will plan to make a referral to my primary care partner in order to perform a work-up for possible inflammatory joint disease.  Plan :    -She will see me back on an as-needed basis   I personally saw and evaluated the patient, and participated in the management and treatment plan.  Huel Cote, MD Attending Physician, Orthopedic Surgery  This document was dictated using Conservation officer, historic buildings. A reasonable attempt at proof reading has been made to minimize errors.

## 2021-08-20 IMAGING — XA DG FLUORO GUIDE NDL PLC/BX
2 series · 3 of 3 positions shown · non-contrast
Comparison: none

CLINICAL DATA: Right hip pain.

[Series 1: ortho adipose · 1 of 1 slices shown (1 of 2)]
[im 1/1]
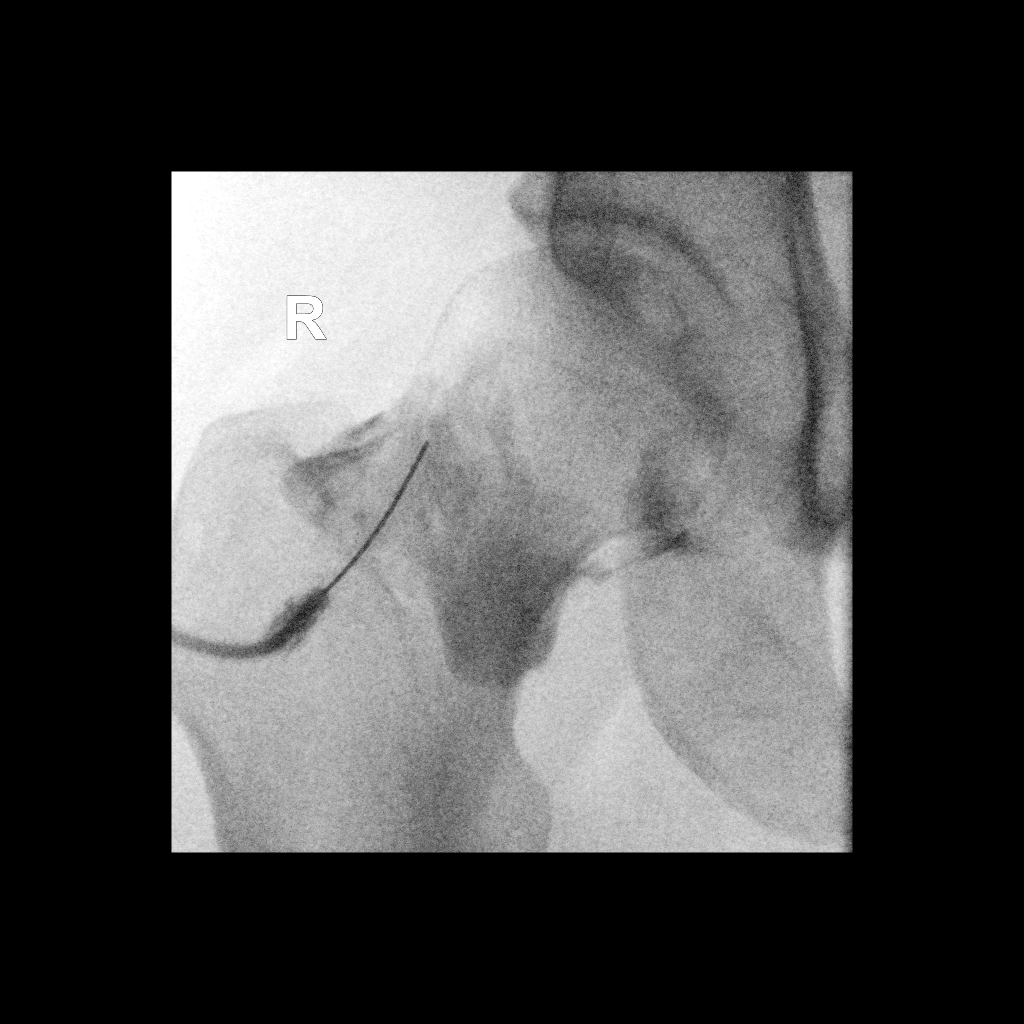

[Series 2: ortho adipose · 2 of 2 slices shown (2 of 2)]
[im 1/2]
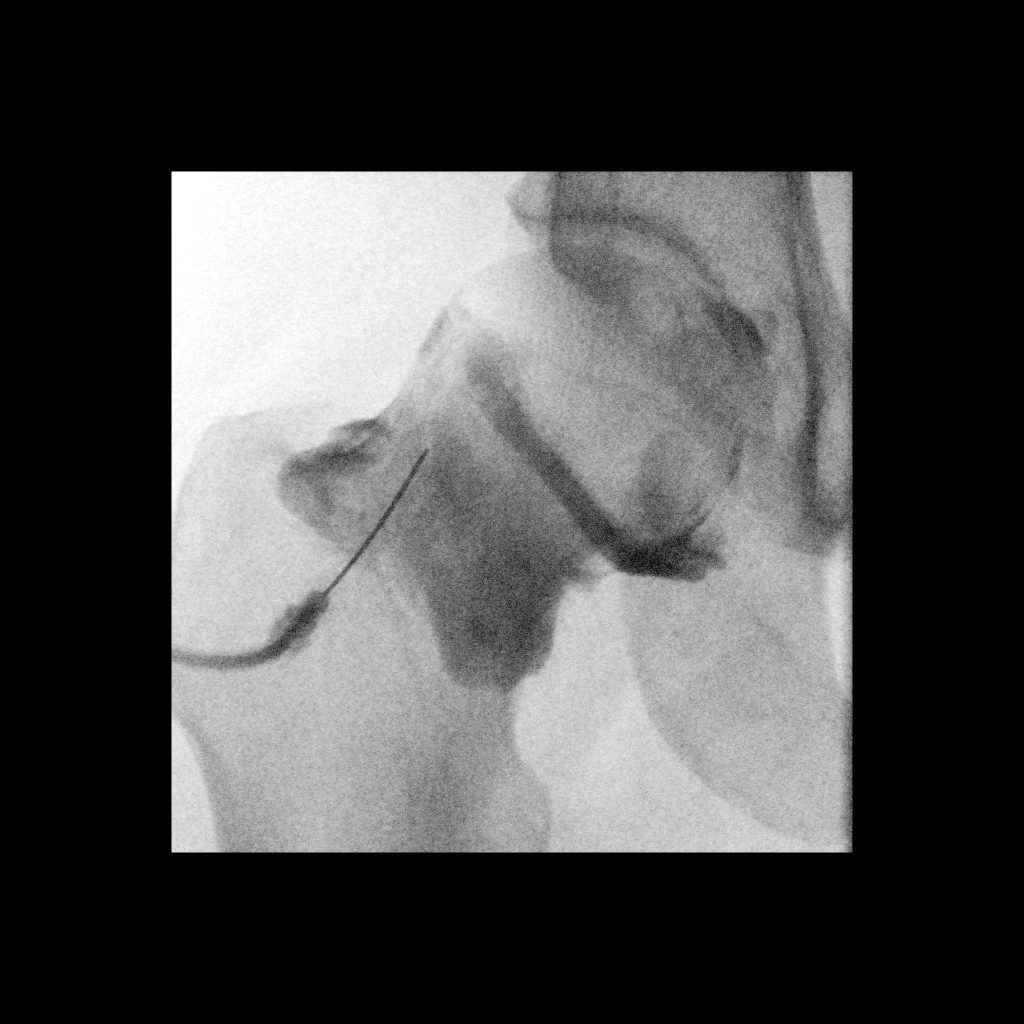
[im 2/2]
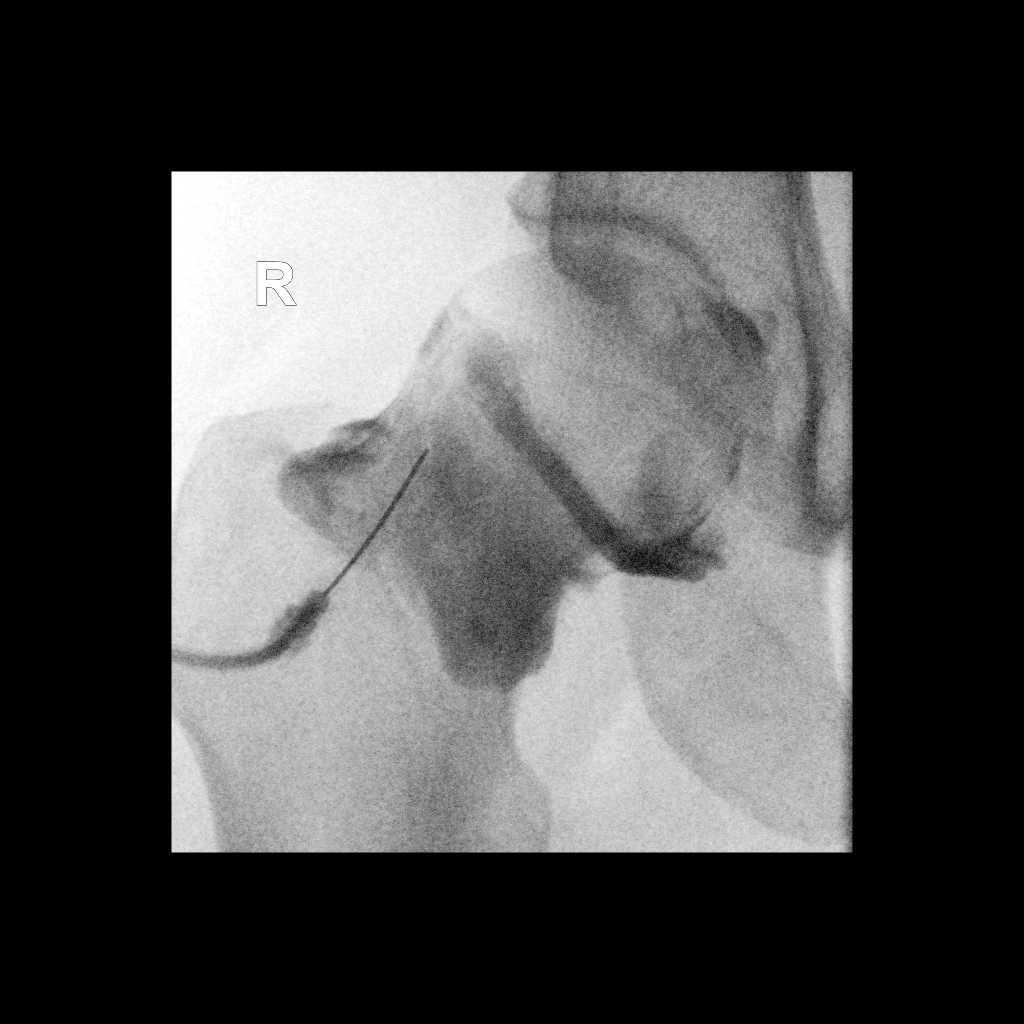

[3 of 3 positions shown; findings below may reference images not displayed]

EXAM:
RIGHT HIP INJECTION FOR MRI

FLUOROSCOPY TIME:  Fluoroscopy Time:  13 seconds

Radiation Exposure Index (if provided by the fluoroscopic device):
14.14 microGray*m^2

Number of Acquired Spot Images: 0

PROCEDURE:
The overlying skin was prepped with Betadine, draped in the usual
sterile fashion, and infiltrated locally with 1% lidocaine. A
inch 22 gauge spinal needle was advanced to the lateral aspect of
the right femoral head-neck junction. 1 mL of 1% lidocaine injected
easily. A mixture of 0.1 mL of MultiHance, 15 mL of Isovue-M 200,
and 5 mL of sterile saline was then used to opacify the right
femoral head. 10 mL of this mixture were injected. The needle was
removed and a sterile dressing was applied. There was no immediate
complication.
IMPRESSION: Technically successful right hip injection under fluoroscopy for MR
arthrogram.

## 2021-10-04 ENCOUNTER — Ambulatory Visit (INDEPENDENT_AMBULATORY_CARE_PROVIDER_SITE_OTHER): Payer: BC Managed Care – PPO | Admitting: Orthopaedic Surgery

## 2021-10-04 DIAGNOSIS — M6520 Calcific tendinitis, unspecified site: Secondary | ICD-10-CM | POA: Diagnosis not present

## 2021-10-04 DIAGNOSIS — M25512 Pain in left shoulder: Secondary | ICD-10-CM | POA: Diagnosis not present

## 2021-10-04 MED ORDER — TRIAMCINOLONE ACETONIDE 40 MG/ML IJ SUSP
80.0000 mg | INTRAMUSCULAR | Status: AC | PRN
  Administered 2021-10-04: 80 mg via INTRA_ARTICULAR

## 2021-10-04 MED ORDER — LIDOCAINE HCL 1 % IJ SOLN
4.0000 mL | INTRAMUSCULAR | Status: AC | PRN
  Administered 2021-10-04: 4 mL

## 2021-10-04 NOTE — Progress Notes (Signed)
Chief Complaint: left shoulder pain     History of Present Illness:   10/04/2021: Presents today for follow-up of the left shoulder.  Overall she did get 6 months of excellent relief although the pain has now recurred similar to how it was prior to an injection.  She is having significant pain with overhead lifting.  She has been taking Advil for the pain.  02/16/2021: Today overall doing very well.  She did have some pain in the first initial days after the injection but subsequently felt a lot better.  She does endorse some weakness about the shoulder although this is very minor.  She is hoping to get back to a similar lifestyle she had before.  She was previously very active with [employer] and going to the gym.  She is joined the gym and determine we will plan to do this.  She is asking today as she does have several other joint conditions including elbows fingers and knees.  She did have hip replacements done at a very young age.  Michelle Ewing is a 46 y.o. female right-hand-dominant presents today for 2 weeks of left shoulder pain.  She states that she had a direct fall onto the side when she fell off on leaves on her way to work.  The pain is significant about the lateral aspect of the shoulder and is waking her up at night.  She endorses pain about the lateral aspect of this.  She has been taking 800 mg of ibuprofen which helps somewhat.  Pain is worse by end of the day.  She is not had any injections or physical therapy at this time denies previous shoulder pain.    Surgical History:   None  PMH/PSH/Family History/Social History/Meds/Allergies:    Past Medical History:  Diagnosis Date  . Anxiety 2014  . Arthritis   . ASCUS with positive high risk HPV   . Depression   . Exercise-induced asthma   . Migraine with aura   . Smoker    Past Surgical History:  Procedure Laterality Date  . 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 TOTAL HIP  ARTHROPLASTY  left  . TOTAL HIP ARTHROPLASTY Right 05/21/2019   Procedure: RIGHT TOTAL HIP ARTHROPLASTY ANTERIOR APPROACH;  Surgeon: Mcarthur Rossetti, MD;  Location: [facility];  Service: Orthopedics;  Laterality: Right;   Social History   Socioeconomic History  . Marital status: Single    Spouse name: Not on file  . Number of children: Not on file  . Years of education: Not on file  . Highest education level: Not on file  Occupational History  . Not on file  Tobacco Use  . Smoking status: Some Days    Types: Cigarettes  . Smokeless tobacco: Never  . Tobacco comments:    smokes when has a glass of alcohol-rarely  Vaping Use  . Vaping Use: Never used  Substance and Sexual Activity  . Alcohol use: Yes    Alcohol/week: 0.0 - 2.0 standard drinks of alcohol  . Drug use: No  . Sexual activity: Not Currently    Partners: Male    Birth control/protection: Abstinence  Other Topics Concern  . Not on file  Social History Narrative  . Not on file   Social Determinants of Health  Financial Resource Strain: Not on file  Food Insecurity: Not on file  Transportation Needs: Not on file  Physical Activity: Not on file  Stress: Not on file  Social Connections: Not on file   Family History  Problem Relation Age of Onset  . Migraines Maternal Grandmother   . Heart disease Maternal Grandmother   . Hodgkin's lymphoma Maternal Grandfather    Allergies  Allergen Reactions  . Bacitracin     welts  . Peroxide [Hydrogen Peroxide]     welt  . Benzalkonium Chloride Rash  . Neosporin [Neomycin-Bacitracin Zn-Polymyx] Rash   Current Outpatient Medications  Medication Sig Dispense Refill  . methylPREDNISolone (MEDROL DOSEPAK) 4 MG TBPK tablet Take as directed 21 each 0  . Biotin 5 MG CAPS Take 5 mg by mouth daily.    . Cholecalciferol (VITAMIN D) 50 MCG (2000 UT) tablet Take 4,000 Units by mouth daily.    . DULoxetine (CYMBALTA) 30 MG capsule TAKE 1 CAPSULE BY MOUTH EVERY DAY 90  capsule 3  . folic acid (FOLVITE) 400 MCG tablet Take 400 mcg by mouth daily.    Marland Kitchen ibuprofen (ADVIL) 800 MG tablet Take 1 tablet (800 mg total) by mouth every 8 (eight) hours as needed. 30 tablet 1  . Multiple Vitamins-Minerals (MULTIVITAMIN WITH MINERALS) tablet Take 2 tablets by mouth daily.     . valACYclovir (VALTREX) 500 MG tablet Take one tablet bid at onset of outbreak for 3 days 30 tablet 1   No current facility-administered medications for this visit.   No results found.  Review of Systems:   A ROS was performed including pertinent positives and negatives as documented in the HPI.  Physical Exam :   Constitutional: NAD and appears stated age Neurological: Alert and oriented Psych: Appropriate affect and cooperative There were no vitals taken for this visit.   Comprehensive Musculoskeletal Exam:    Musculoskeletal Exam    Inspection Right Left  Skin No atrophy or winging No atrophy or winging  Palpation    Tenderness None None  Range of Motion    Flexion (passive) 170 170  Flexion (active) 170 170  Abduction 170 170  ER at the side 70 70  Can reach behind back to T12 T12  Strength     Full 4+ out of 5  Special Tests    Pseudoparalytic No No  Neurologic    Fires PIN, radial, median, ulnar, musculocutaneous, axillary, suprascapular, long thoracic, and spinal accessory innervated muscles. No abnormal sensibility  Vascular/Lymphatic    Radial Pulse 2+ 2+  Cervical Exam    Patient has symmetric cervical range of motion with negative Spurling's test.  Special Test:      Imaging:   Xray (3 views left shoulder): Calcific tendinitis deposit within the supraspinatus tendon  MRI left shoulder: Rotator cuff is intact with evidence of resolution of the calcific tendinitis  I personally reviewed and interpreted the radiographs.   Assessment:   46 year old female with left shoulder calcific tendinitis.  While she is hoping for an additional left shoulder injection  today as she did get significant relief from this.  I will plan to perform this.  We did discuss the role for arthroscopic debridement if this were to occur.  She will continue to work with her physical therapist at her gym.  Plan :    -Left shoulder ultrasound-guided subacromial injection performed after verbal consent   Procedure Note  Patient: Michelle Ewing  Date of Birth: Aug 16, 1975           MRN: [id number]             Visit Date: 10/04/2021  Procedures: Visit Diagnoses:  1. Calcific tendinitis     Large Joint Inj: L subacromial bursa on 10/04/2021 12:01 PM Indications: pain Details: 22 G 1.5 in needle, ultrasound-guided anterior approach  Arthrogram: No  Medications: 4 mL lidocaine 1 %; 80 mg triamcinolone acetonide 40 MG/ML Outcome: tolerated well, no immediate complications Procedure, treatment alternatives, risks and benefits explained, specific risks discussed. Consent was given by the patient. Immediately prior to procedure a time out was called to verify the correct patient, procedure, equipment, support staff and site/side marked as required. Patient was prepped and draped in the usual sterile fashion.        I personally saw and evaluated the patient, and participated in the management and treatment plan.  Huel Cote, MD Attending Physician, Orthopedic Surgery  This document was dictated using Dragon voice recognition software. A reasonable attempt at proof reading has been made to minimize errors.

## 2021-10-10 ENCOUNTER — Encounter: Payer: Self-pay | Admitting: Obstetrics and Gynecology

## 2021-10-10 DIAGNOSIS — B009 Herpesviral infection, unspecified: Secondary | ICD-10-CM

## 2021-10-10 MED ORDER — VALACYCLOVIR HCL 500 MG PO TABS: ORAL_TABLET | ORAL | 1 refills | Status: DC

## 2021-10-10 NOTE — Telephone Encounter (Signed)
AEX was 11/08/20.  I messaged patient back recommend OV for UTI sx to be assessed.

## 2021-10-17 ENCOUNTER — Ambulatory Visit (HOSPITAL_BASED_OUTPATIENT_CLINIC_OR_DEPARTMENT_OTHER): Payer: BC Managed Care – PPO | Admitting: Orthopaedic Surgery

## 2021-11-09 ENCOUNTER — Other Ambulatory Visit: Payer: Self-pay | Admitting: Obstetrics and Gynecology

## 2021-11-09 DIAGNOSIS — Z8659 Personal history of other mental and behavioral disorders: Secondary | ICD-10-CM

## 2021-11-09 NOTE — Telephone Encounter (Signed)
Last annual exam was 11/08/20 Scheduled on 12/05/21 Last mammogram 02/2021

## 2021-11-13 NOTE — Telephone Encounter (Signed)
Script sent  

## 2021-11-22 ENCOUNTER — Ambulatory Visit: Payer: BC Managed Care – PPO | Admitting: Obstetrics and Gynecology

## 2021-11-29 ENCOUNTER — Ambulatory Visit: Payer: BC Managed Care – PPO | Admitting: Obstetrics and Gynecology

## 2021-12-05 ENCOUNTER — Ambulatory Visit: Payer: BC Managed Care – PPO | Admitting: Obstetrics and Gynecology

## 2021-12-10 DIAGNOSIS — M6281 Muscle weakness (generalized): Secondary | ICD-10-CM | POA: Diagnosis not present

## 2021-12-10 DIAGNOSIS — M25512 Pain in left shoulder: Secondary | ICD-10-CM | POA: Diagnosis not present

## 2021-12-17 DIAGNOSIS — M25512 Pain in left shoulder: Secondary | ICD-10-CM | POA: Diagnosis not present

## 2021-12-17 DIAGNOSIS — M6281 Muscle weakness (generalized): Secondary | ICD-10-CM | POA: Diagnosis not present

## 2021-12-31 DIAGNOSIS — M25512 Pain in left shoulder: Secondary | ICD-10-CM | POA: Diagnosis not present

## 2021-12-31 DIAGNOSIS — M6281 Muscle weakness (generalized): Secondary | ICD-10-CM | POA: Diagnosis not present

## 2022-01-03 DIAGNOSIS — M25512 Pain in left shoulder: Secondary | ICD-10-CM | POA: Diagnosis not present

## 2022-01-03 DIAGNOSIS — M6281 Muscle weakness (generalized): Secondary | ICD-10-CM | POA: Diagnosis not present

## 2022-01-07 DIAGNOSIS — M6281 Muscle weakness (generalized): Secondary | ICD-10-CM | POA: Diagnosis not present

## 2022-01-07 DIAGNOSIS — M25512 Pain in left shoulder: Secondary | ICD-10-CM | POA: Diagnosis not present

## 2022-01-22 DIAGNOSIS — M25561 Pain in right knee: Secondary | ICD-10-CM | POA: Diagnosis not present

## 2022-01-22 DIAGNOSIS — M6281 Muscle weakness (generalized): Secondary | ICD-10-CM | POA: Diagnosis not present

## 2022-01-22 DIAGNOSIS — M25512 Pain in left shoulder: Secondary | ICD-10-CM | POA: Diagnosis not present

## 2022-02-05 ENCOUNTER — Other Ambulatory Visit: Payer: Self-pay | Admitting: Obstetrics and Gynecology

## 2022-02-05 DIAGNOSIS — Z8659 Personal history of other mental and behavioral disorders: Secondary | ICD-10-CM

## 2022-02-18 ENCOUNTER — Other Ambulatory Visit: Payer: Self-pay | Admitting: *Deleted

## 2022-02-18 DIAGNOSIS — Z8659 Personal history of other mental and behavioral disorders: Secondary | ICD-10-CM

## 2022-02-18 MED ORDER — DULOXETINE HCL 30 MG PO CPEP: 30.0000 mg | ORAL_CAPSULE | Freq: Every day | ORAL | 0 refills | Status: DC

## 2022-02-18 NOTE — Telephone Encounter (Signed)
Patient called has annual exam scheduled on 03/28/22, needs refill on Cymbalta 30 mg capsule.

## 2022-02-19 NOTE — Telephone Encounter (Signed)
Dr.Jertson send Rx.

## 2022-03-14 DIAGNOSIS — F419 Anxiety disorder, unspecified: Secondary | ICD-10-CM | POA: Diagnosis not present

## 2022-03-14 DIAGNOSIS — N926 Irregular menstruation, unspecified: Secondary | ICD-10-CM | POA: Diagnosis not present

## 2022-03-14 DIAGNOSIS — G43009 Migraine without aura, not intractable, without status migrainosus: Secondary | ICD-10-CM | POA: Diagnosis not present

## 2022-03-14 DIAGNOSIS — Z23 Encounter for immunization: Secondary | ICD-10-CM | POA: Diagnosis not present

## 2022-03-14 DIAGNOSIS — Z0001 Encounter for general adult medical examination with abnormal findings: Secondary | ICD-10-CM | POA: Diagnosis not present

## 2022-03-14 DIAGNOSIS — R002 Palpitations: Secondary | ICD-10-CM | POA: Diagnosis not present

## 2022-03-14 DIAGNOSIS — Z13220 Encounter for screening for lipoid disorders: Secondary | ICD-10-CM | POA: Diagnosis not present

## 2022-03-14 DIAGNOSIS — Z131 Encounter for screening for diabetes mellitus: Secondary | ICD-10-CM | POA: Diagnosis not present

## 2022-03-21 NOTE — Progress Notes (Unsigned)
47 y.o. [id number] Single   Black or African American White or Caucasian Not Hispanic or Latino female here for annual exam.  Patient has received estradiol and progesterone through an online doctor.  Period Pattern: (!) Irregular (Last months she had 3 days of spotting then it stopped and then she had heavy bleeding for 7 days. She has had light spotting Sunday Monday Tuesday.  She is till cramping and has tender breast.) Menstrual Flow: Light Menstrual Control: Panty liner Dysmenorrhea: (!) Mild Dysmenorrhea Symptoms: Cramping  She started on HRT with an on line MD for multiple c/o, hot flashes, night sweats, weight gain, headaches with her cycle. She does have migraines with auras.   Prior to starting HRT her cycles were monthly x 4-6 days. They were getting heavier, she would saturate a super tampon in 4 hours.  Since she started daily HRT (estrace 2 gm, prometrium 200mg  qd) at the end of October.  In November she had a normal cycle (11/15-11/18). Then spotted a few days the week of 11/27.  In December she bleed for 4 days starting on 12/16. Then started bleeding 1/21, just spotting.   Her hot flashes and night sweats have improved, she is sleeping better, no weight changes.   She is working out, eating healthy, portion control, intermittent fasting and can't loose weight.  Recent labs with her primary, normal TSH.  She had covid in 12/23, has had vertigo since then.   H/O HSV 2, infrequent outbreaks.   Patient's last menstrual period was 03/24/2022.          Sexually active: No.  The current method of family planning is none.    Exercising: Yes.    Gym/ health club routine includes cardio and personal trainer . Smoker:  no  Health Maintenance: Pap:   04/02/19 WNL Hr HPV Neg; 03-13-17 neg HPV HR neg  History of abnormal Pap:  yes h/o CIN I MMG:  02/05/21  density C Bi-rads 1 neg  BMD:   n/a Colonoscopy: She has one scheduled for April.  TDaP:  04/04/2010 Gardasil: no    reports  that she has been smoking cigarettes. She has never used smokeless tobacco. She reports current alcohol use. She reports that she does not use drugs. Just occasional ETOH. 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 Younger daughter is a Administrator, arts at [employer]. Older daughter lives in [place].   Past Medical History:  Diagnosis Date   Anxiety 2014   Arthritis    ASCUS with positive high risk HPV    Depression    Exercise-induced asthma    Migraine with aura    Smoker     Past Surgical History:  Procedure Laterality Date   COLPOSCOPY  11/28/10   With hx CIN , 2014 [facility]   TOTAL HIP ARTHROPLASTY  left   TOTAL HIP ARTHROPLASTY Right 05/21/2019   Procedure: RIGHT TOTAL HIP ARTHROPLASTY ANTERIOR APPROACH;  Surgeon: Mcarthur Rossetti, MD;  Location: [facility];  Service: Orthopedics;  Laterality: Right;    Current Outpatient Medications  Medication Sig Dispense Refill   Biotin 5 MG CAPS Take 5 mg by mouth daily.     Cholecalciferol (VITAMIN D) 50 MCG (2000 UT) tablet Take 4,000 Units by mouth daily.     DHEA 50 MG CAPS Take by mouth.     DULoxetine (CYMBALTA) 30 MG capsule Take 1 capsule (30 mg total) by mouth daily. TAKE 1 CAPSULE BY MOUTH EVERY DAY 90 capsule 0   estradiol (ESTRACE) 2 MG  tablet Take 2 mg by mouth daily.     ibuprofen (ADVIL) 800 MG tablet Take 1 tablet (800 mg total) by mouth every 8 (eight) hours as needed. 30 tablet 1   Multiple Vitamins-Minerals (MULTIVITAMIN WITH MINERALS) tablet Take 2 tablets by mouth daily.      Probiotic Product (PROBIOTIC PO) Take by mouth.     progesterone 200 MG SUPP Place 200 mg vaginally at bedtime.     valACYclovir (VALTREX) 500 MG tablet Take one tablet bid at onset of outbreak for 3 days 30 tablet 1   No current facility-administered medications for this visit.    Family History  Problem Relation Age of Onset   Migraines Maternal Grandmother    Heart disease Maternal Grandmother    Hodgkin's lymphoma Maternal Grandfather     Review of Systems   Genitourinary:  Positive for menstrual problem.  All other systems reviewed and are negative.   Exam:   BP 124/72   Pulse 66   Ht 5\' 7"  (1.702 m)   Wt 185 lb (83.9 kg)   LMP 03/24/2022   SpO2 98%   BMI 28.98 kg/m   Weight change: @WEIGHTCHANGE @ Height:   Height: 5\' 7"  (170.2 cm)  Ht Readings from Last 3 Encounters:  03/28/22 5\' 7"  (1.702 m)  11/08/20 5\' 7"  (1.702 m)  08/14/20 5\' 7"  (1.702 m)   General appearance: alert, cooperative and appears stated age Head: Normocephalic, without obvious abnormality, atraumatic Neck: no adenopathy, supple, symmetrical, trachea midline and thyroid normal to inspection and palpation Lungs: clear to auscultation bilaterally Cardiovascular: regular rate and rhythm Breasts: normal appearance, no masses or tenderness Abdomen: soft, non-tender; non distended,  no masses,  no organomegaly Extremities: extremities normal, atraumatic, no cyanosis or edema Skin: Skin color, texture, turgor normal. No rashes or lesions Lymph nodes: Cervical, supraclavicular, and axillary nodes normal. No abnormal inguinal nodes palpated Neurologic: Grossly normal   Pelvic: External genitalia:  no lesions              Urethra:  normal appearing urethra with no masses, tenderness or lesions              Bartholins and Skenes: normal                 Vagina: normal appearing vagina with normal color and discharge, no lesions              Cervix: no lesions               Bimanual Exam:  Uterus:  normal size, contour, position, consistency, mobility, non-tender              Adnexa: no mass, fullness, tenderness               Rectovaginal: Confirms               Anus:  normal sphincter tone, no lesions  Gae Dry, CMA chaperoned for the exam.  1. Well woman exam Discussed breast self exam Discussed calcium and vit D intake She will set up her mammogram Colonoscopy scheduled  2. History of depression Controlled on medication - DULoxetine (CYMBALTA) 30 MG  capsule; Take 1 capsule (30 mg total) by mouth daily. TAKE 1 CAPSULE BY MOUTH EVERY DAY  Dispense: 90 capsule; Refill: 3  3. Herpes Rare - valACYclovir (VALTREX) 500 MG tablet; Take one tablet bid at onset of outbreak for 3 days  Dispense: 30 tablet; Refill: 1  4. Hormone replacement therapy (HRT)  Discussed risks of HRT, reviewed the option of changing to transdermal estrogen and cyclic progesterone. She has a 90 day supply of the medication. Will switch to cyclic progesterone for 12 days a month now. She will consider transitioning to transdermal estrogen when she is out of medication.   5. Screening examination for STD (sexually transmitted disease) - SURESWAB CT/NG/T. vaginalis - RPR - HIV Antibody (routine testing w rflx) - Hepatitis C antibody  6. Immunization due - Tdap vaccine greater than or equal to 7yo IM

## 2022-03-28 ENCOUNTER — Encounter: Payer: Self-pay | Admitting: Obstetrics and Gynecology

## 2022-03-28 ENCOUNTER — Ambulatory Visit (INDEPENDENT_AMBULATORY_CARE_PROVIDER_SITE_OTHER): Payer: BC Managed Care – PPO | Admitting: Obstetrics and Gynecology

## 2022-03-28 VITALS — BP 124/72 | HR 66 | Ht 67.0 in | Wt 185.0 lb

## 2022-03-28 DIAGNOSIS — B009 Herpesviral infection, unspecified: Secondary | ICD-10-CM | POA: Diagnosis not present

## 2022-03-28 DIAGNOSIS — Z79890 Hormone replacement therapy (postmenopausal): Secondary | ICD-10-CM | POA: Diagnosis not present

## 2022-03-28 DIAGNOSIS — Z01419 Encounter for gynecological examination (general) (routine) without abnormal findings: Secondary | ICD-10-CM

## 2022-03-28 DIAGNOSIS — Z8659 Personal history of other mental and behavioral disorders: Secondary | ICD-10-CM | POA: Diagnosis not present

## 2022-03-28 DIAGNOSIS — Z113 Encounter for screening for infections with a predominantly sexual mode of transmission: Secondary | ICD-10-CM | POA: Diagnosis not present

## 2022-03-28 DIAGNOSIS — Z23 Encounter for immunization: Secondary | ICD-10-CM | POA: Diagnosis not present

## 2022-03-28 MED ORDER — VALACYCLOVIR HCL 500 MG PO TABS: ORAL_TABLET | ORAL | 1 refills | Status: AC

## 2022-03-28 MED ORDER — DULOXETINE HCL 30 MG PO CPEP: 30.0000 mg | ORAL_CAPSULE | Freq: Every day | ORAL | 3 refills | Status: AC

## 2022-03-28 NOTE — Patient Instructions (Signed)
Take the progesterone for 12 days a month, start 2 weeks prior to your expected cycle (then stick to those days every month). Take the progesterone at bedtime to help with sleep.  EXERCISE   We recommended that you start or continue a regular exercise program for good health. Physical activity is anything that gets your body moving, some is better than none. The CDC recommends 150 minutes per week of Moderate-Intensity Aerobic Activity and 2 or more days of Muscle Strengthening Activity.  Benefits of exercise are limitless: helps weight loss/weight maintenance, improves mood and energy, helps with depression and anxiety, improves sleep, tones and strengthens muscles, improves balance, improves bone density, protects from chronic conditions such as heart disease, high blood pressure and diabetes and so much more. To learn more visit: WhyNotPoker.uy  DIET: Good nutrition starts with a healthy diet of fruits, vegetables, whole grains, and lean protein sources. Drink plenty of water for hydration. Minimize empty calories, sodium, sweets. For more information about dietary recommendations visit: GeekRegister.com.ee and http://schaefer-mitchell.com/  ALCOHOL:  Women should limit their alcohol intake to no more than 7 drinks/beers/glasses of wine (combined, not each!) per week. Moderation of alcohol intake to this level decreases your risk of breast cancer and liver damage.  If you are concerned that you may have a problem, or your friends have told you they are concerned about your drinking, there are many resources to help. A well-known program that is free, effective, and available to all people all over the nation is Alcoholics Anonymous.  Check out this site to learn more: BlockTaxes.se   CALCIUM AND VITAMIN D:  Adequate intake of calcium and Vitamin D are recommended for bone health.  You should be getting between  1000-1200 mg of calcium and 800 units of Vitamin D daily between diet and supplements  PAP SMEARS:  Pap smears, to check for cervical cancer or precancers,  have traditionally been done yearly, scientific advances have shown that most women can have pap smears less often.  However, every woman still should have a physical exam from her gynecologist every year. It will include a breast check, inspection of the vulva and vagina to check for abnormal growths or skin changes, a visual exam of the cervix, and then an exam to evaluate the size and shape of the uterus and ovaries. We will also provide age appropriate advice regarding health maintenance, like when you should have certain vaccines, screening for sexually transmitted diseases, bone density testing, colonoscopy, mammograms, etc.   MAMMOGRAMS:  All women over 43 years old should have a routine mammogram.   COLON CANCER SCREENING: Now recommend starting at age 83. At this time colonoscopy is not covered for routine screening until 50. There are take home tests that can be done between 45-49.   COLONOSCOPY:  Colonoscopy to screen for colon cancer is recommended for all women at age 55.  We know, you hate the idea of the prep.  We agree, BUT, having colon cancer and not knowing it is worse!!  Colon cancer so often starts as a polyp that can be seen and removed at colonscopy, which can quite literally save your life!  And if your first colonoscopy is normal and you have no family history of colon cancer, most women don't have to have it again for 10 years.  Once every ten years, you can do something that may end up saving your life, right?  We will be happy to help you get it scheduled when you are  ready.  Be sure to check your insurance coverage so you understand how much it will cost.  It may be covered as a preventative service at no cost, but you should check your particular policy.      Breast Self-Awareness Breast self-awareness means being  familiar with how your breasts look and feel. It involves checking your breasts regularly and reporting any changes to your health care provider. Practicing breast self-awareness is important. A change in your breasts can be a sign of a serious medical problem. Being familiar with how your breasts look and feel allows you to find any problems early, when treatment is more likely to be successful. All women should practice breast self-awareness, including women who have had breast implants. How to do a breast self-exam One way to learn what is normal for your breasts and whether your breasts are changing is to do a breast self-exam. To do a breast self-exam: Look for Changes  Remove all the clothing above your waist. Stand in front of a mirror in a room with good lighting. Put your hands on your hips. Push your hands firmly downward. Compare your breasts in the mirror. Look for differences between them (asymmetry), such as: Differences in shape. Differences in size. Puckers, dips, and bumps in one breast and not the other. Look at each breast for changes in your skin, such as: Redness. Scaly areas. Look for changes in your nipples, such as: Discharge. Bleeding. Dimpling. Redness. A change in position. Feel for Changes Carefully feel your breasts for lumps and changes. It is best to do this while lying on your back on the floor and again while sitting or standing in the shower or tub with soapy water on your skin. Feel each breast in the following way: Place the arm on the side of the breast you are examining above your head. Feel your breast with the other hand. Start in the nipple area and make  inch (2 cm) overlapping circles to feel your breast. Use the pads of your three middle fingers to do this. Apply light pressure, then medium pressure, then firm pressure. The light pressure will allow you to feel the tissue closest to the skin. The medium pressure will allow you to feel the tissue  that is a little deeper. The firm pressure will allow you to feel the tissue close to the ribs. Continue the overlapping circles, moving downward over the breast until you feel your ribs below your breast. Move one finger-width toward the center of the body. Continue to use the  inch (2 cm) overlapping circles to feel your breast as you move slowly up toward your collarbone. Continue the up and down exam using all three pressures until you reach your armpit.  Write Down What You Find  Write down what is normal for each breast and any changes that you find. Keep a written record with breast changes or normal findings for each breast. By writing this information down, you do not need to depend only on memory for size, tenderness, or location. Write down where you are in your menstrual cycle, if you are still menstruating. If you are having trouble noticing differences in your breasts, do not get discouraged. With time you will become more familiar with the variations in your breasts and more comfortable with the exam. How often should I examine my breasts? Examine your breasts every month. If you are breastfeeding, the best time to examine your breasts is after a feeding or after using  a breast pump. If you menstruate, the best time to examine your breasts is 5-7 days after your period is over. During your period, your breasts are lumpier, and it may be more difficult to notice changes. When should I see my health care provider? See your health care provider if you notice: A change in shape or size of your breasts or nipples. A change in the skin of your breast or nipples, such as a reddened or scaly area. Unusual discharge from your nipples. A lump or thick area that was not there before. Pain in your breasts. Anything that concerns you.

## 2022-03-29 ENCOUNTER — Encounter: Payer: Self-pay | Admitting: Obstetrics and Gynecology

## 2022-03-29 LAB — RPR: RPR Ser Ql: NONREACTIVE

## 2022-03-29 LAB — HEPATITIS C ANTIBODY: Hepatitis C Ab: NONREACTIVE

## 2022-03-29 LAB — HIV ANTIBODY (ROUTINE TESTING W REFLEX): HIV 1&2 Ab, 4th Generation: NONREACTIVE

## 2022-03-29 NOTE — Addendum Note (Signed)
Addended by: Yates Decamp on: 03/29/2022 03:49 PM   Modules accepted: Orders

## 2022-03-30 LAB — SURESWAB CT/NG/T. VAGINALIS
C. trachomatis RNA, TMA: NOT DETECTED
N. gonorrhoeae RNA, TMA: NOT DETECTED
Trichomonas vaginalis RNA: NOT DETECTED

## 2022-05-17 ENCOUNTER — Other Ambulatory Visit: Payer: Self-pay | Admitting: Obstetrics and Gynecology

## 2022-05-17 DIAGNOSIS — Z8659 Personal history of other mental and behavioral disorders: Secondary | ICD-10-CM

## 2022-05-17 NOTE — Telephone Encounter (Signed)
Last AEX 03/28/2022--recall placed for 2025.  Rx sent at time of AEX for #90 w/ 3 refills to alternate pharmacy. Will call pt to confirm that current pharmacy is still correct.   LDVM on machine per DPR for her to cb and confirm.

## 2022-06-17 DIAGNOSIS — Z1231 Encounter for screening mammogram for malignant neoplasm of breast: Secondary | ICD-10-CM | POA: Diagnosis not present

## 2022-07-05 DIAGNOSIS — Z1211 Encounter for screening for malignant neoplasm of colon: Secondary | ICD-10-CM | POA: Diagnosis not present

## 2023-01-13 DIAGNOSIS — F419 Anxiety disorder, unspecified: Secondary | ICD-10-CM | POA: Diagnosis not present

## 2023-02-04 DIAGNOSIS — J029 Acute pharyngitis, unspecified: Secondary | ICD-10-CM | POA: Diagnosis not present

## 2023-10-03 ENCOUNTER — Ambulatory Visit (INDEPENDENT_AMBULATORY_CARE_PROVIDER_SITE_OTHER): Admitting: Sports Medicine

## 2023-10-03 ENCOUNTER — Ambulatory Visit (INDEPENDENT_AMBULATORY_CARE_PROVIDER_SITE_OTHER)

## 2023-10-03 VITALS — BP 128/78 | HR 92 | Ht 67.0 in | Wt 183.0 lb

## 2023-10-03 DIAGNOSIS — M533 Sacrococcygeal disorders, not elsewhere classified: Secondary | ICD-10-CM | POA: Diagnosis not present

## 2023-10-03 DIAGNOSIS — M5442 Lumbago with sciatica, left side: Secondary | ICD-10-CM | POA: Diagnosis not present

## 2023-10-03 DIAGNOSIS — G8929 Other chronic pain: Secondary | ICD-10-CM

## 2023-10-03 DIAGNOSIS — M5441 Lumbago with sciatica, right side: Secondary | ICD-10-CM | POA: Diagnosis not present

## 2023-10-03 DIAGNOSIS — M2550 Pain in unspecified joint: Secondary | ICD-10-CM | POA: Diagnosis not present

## 2023-10-03 DIAGNOSIS — M25559 Pain in unspecified hip: Secondary | ICD-10-CM | POA: Diagnosis not present

## 2023-10-03 LAB — COMPREHENSIVE METABOLIC PANEL WITH GFR
ALT: 10 U/L (ref 0–35)
AST: 16 U/L (ref 0–37)
Albumin: 4.4 g/dL (ref 3.5–5.2)
Alkaline Phosphatase: 62 U/L (ref 39–117)
BUN: 9 mg/dL (ref 6–23)
CO2: 25 meq/L (ref 19–32)
Calcium: 9.3 mg/dL (ref 8.4–10.5)
Chloride: 105 meq/L (ref 96–112)
Creatinine, Ser: 0.83 mg/dL (ref 0.40–1.20)
GFR: 82.5 mL/min (ref >60.00)
Glucose, Bld: 94 mg/dL (ref 70–99)
Potassium: 4.2 meq/L (ref 3.5–5.1)
Sodium: 138 meq/L (ref 135–145)
Total Bilirubin: 0.4 mg/dL (ref 0.2–1.2)
Total Protein: 7.5 g/dL (ref 6.0–8.3)

## 2023-10-03 LAB — CBC WITH DIFFERENTIAL/PLATELET
Basophils Absolute: 0.1 K/uL (ref 0.0–0.1)
Basophils Relative: 0.5 % (ref 0.0–3.0)
Eosinophils Absolute: 0.1 K/uL (ref 0.0–0.7)
Eosinophils Relative: 0.9 % (ref 0.0–5.0)
HCT: 39.8 % (ref 36.0–46.0)
Hemoglobin: 13 g/dL (ref 12.0–15.0)
Lymphocytes Relative: 19.5 % (ref 12.0–46.0)
Lymphs Abs: 1.9 K/uL (ref 0.7–4.0)
MCHC: 32.6 g/dL (ref 30.0–36.0)
MCV: 87.1 fl (ref 78.0–100.0)
Monocytes Absolute: 0.8 K/uL (ref 0.1–1.0)
Monocytes Relative: 8.1 % (ref 3.0–12.0)
Neutro Abs: 6.8 K/uL (ref 1.4–7.7)
Neutrophils Relative %: 71 % (ref 43.0–77.0)
Platelets: 351 K/uL (ref 150.0–400.0)
RBC: 4.57 Mil/uL (ref 3.87–5.11)
RDW: 15.4 % (ref 11.5–15.5)
WBC: 9.6 K/uL (ref 4.0–10.5)

## 2023-10-03 LAB — TSH: TSH: 0.77 u[IU]/mL (ref 0.35–5.50)

## 2023-10-03 LAB — SEDIMENTATION RATE: Sed Rate: 20 mm/h (ref 0–20)

## 2023-10-03 LAB — VITAMIN D 25 HYDROXY (VIT D DEFICIENCY, FRACTURES): VITD: 51.82 ng/mL (ref 30.00–100.00)

## 2023-10-03 LAB — FERRITIN: Ferritin: 10.7 ng/mL (ref 10.0–291.0)

## 2023-10-03 LAB — C-REACTIVE PROTEIN: CRP: <1 mg/dL (ref 0.5–20.0)

## 2023-10-03 LAB — URIC ACID: Uric Acid, Serum: 3.2 mg/dL (ref 2.4–7.0)

## 2023-10-03 MED ORDER — MELOXICAM 15 MG PO TABS: 15.0000 mg | ORAL_TABLET | Freq: Every day | ORAL | 0 refills | Status: AC

## 2023-10-03 NOTE — Patient Instructions (Signed)
-   Start meloxicam 15 mg daily x2 weeks.  If still having pain after 2 weeks, complete 3rd-week of NSAID. May use remaining NSAID as needed once daily for pain control.  Do not to use additional over-the-counter NSAIDs (ibuprofen , naproxen, Advil , Aleve, etc.) while taking prescription NSAIDs.  May use Tylenol  [phone] mg 2 to 3 times a day for breakthrough pain.  Labs today. Hep for low back and hips. Xray lumbar spine and pelvis on the way out. Follow up in 4 weeks.

## 2023-10-03 NOTE — Progress Notes (Signed)
 [facility] [address] [place] [address] Phone: [phone]   Assessment and Plan:     1. Chronic bilateral low back pain with sciatica, sciatica laterality unspecified 2. Chronic left SI joint pain 3. Polyarthralgia 4. Chronic right SI joint pain -Chronic with exacerbation, initial sports medicine visit - Patient presents with ongoing back pain with lower extremity radicular symptoms.  On physical exam patient has pain through lumbar spine, SI joints, bilateral gluteal musculature, bilateral greater trochanters.  Will start conservative therapy for multiple areas of musculoskeletal pain and reassess at follow-up visit - Start meloxicam 15 mg daily x2 weeks.  If still having pain after 2 weeks, complete 3rd-week of NSAID. May use remaining NSAID as needed once daily for pain control.  Do not to use additional over-the-counter NSAIDs (ibuprofen , naproxen, Advil , Aleve, etc.) while taking prescription NSAIDs.  May use Tylenol  [phone] mg 2 to 3 times a day for breakthrough pain. - Start HEP for low back and hips - Will obtain x-rays of the lumbar spine and pelvis at today's visit.  Will further evaluate at follow-up visit - Due to chronic polyarthralgia without specific MOI, recommend further evaluation with autoimmune lab work.  Lab work obtained today and will be further reviewed at follow-up visit  15 additional minutes spent for educating Therapeutic Home Exercise Program.  This included exercises focusing on stretching, strengthening, with focus on eccentric aspects.   Long term goals include an improvement in range of motion, strength, endurance as well as avoiding reinjury. Patient's frequency would include in 1-2 times a day, 3-5 times a week for a duration of 6-12 weeks. Proper technique shown and discussed handout in great detail with ATC.  All questions were discussed and answered.    Pertinent previous records reviewed  include none  Follow Up: 4 weeks for reevaluation.  Would review lab work, x-ray imaging.  Could consider physical therapy versus CSI   Subjective:   I, [name], am serving as a Neurosurgeon for Doctor Morene Mace  Chief Complaint: low back pain   HPI:   10/03/23 Patient is a 48 year old female with low back pain. Patient states pain started getting worse a couple of weeks ago but has been here for months. Right sided low back that radiates with numbness down her leg. Tylenol  and ibu for the pain helps enough. Hx of bilat hip replacements. Has started working back out. Decreased ROM. Stretching helps some but the pain is always there. Pain is the worst at the end of the day.    Relevant Historical Information: Bilateral hip replacements  Additional pertinent review of systems negative.   Current Outpatient Medications:    meloxicam (MOBIC) 15 MG tablet, Take 1 tablet (15 mg total) by mouth daily., Disp: 30 tablet, Rfl: 0   Biotin  5 MG CAPS, Take 5 mg by mouth daily., Disp: , Rfl:    Cholecalciferol  (VITAMIN D ) 50 MCG (2000 UT) tablet, Take 4,000 Units by mouth daily., Disp: , Rfl:    DHEA 50 MG CAPS, Take by mouth., Disp: , Rfl:    DULoxetine  (CYMBALTA ) 30 MG capsule, Take 1 capsule (30 mg total) by mouth daily. TAKE 1 CAPSULE BY MOUTH EVERY DAY, Disp: 90 capsule, Rfl: 3   estradiol (ESTRACE) 2 MG tablet, Take 2 mg by mouth daily., Disp: , Rfl:    ibuprofen  (ADVIL ) 800 MG tablet, Take 1 tablet (800 mg total) by mouth every 8 (eight) hours as needed.,  Disp: 30 tablet, Rfl: 1   Multiple Vitamins-Minerals (MULTIVITAMIN WITH MINERALS) tablet, Take 2 tablets by mouth daily. , Disp: , Rfl:    Probiotic Product (PROBIOTIC PO), Take by mouth., Disp: , Rfl:    progesterone (PROMETRIUM) 200 MG capsule, Take 200 mg by mouth daily., Disp: , Rfl:    valACYclovir  (VALTREX ) 500 MG tablet, Take one tablet bid at onset of outbreak for 3 days, Disp: 30 tablet, Rfl: 1   Objective:     Vitals:    10/03/23 1038  BP: 128/78  Pulse: 92  SpO2: 98%  Weight: 183 lb (83 kg)  Height: 5' 7 (1.702 m)      Body mass index is 28.66 kg/m.    Physical Exam:    Gen: Appears well, nad, nontoxic and pleasant Psych: Alert and oriented, appropriate mood and affect Neuro: sensation intact, strength is 5/5 in upper and lower extremities, muscle tone wnl Skin: no susupicious lesions or rashes  Back - Normal skin, Spine with normal alignment and no deformity.   Mild tenderness to lumbar vertebral process palpation.    TTP gluteal musculature, bilateral lumbar paraspinal musculature, bilateral greater trochanter, bilateral SI joint Straight leg raise positive for nonradiating low back pain bilaterally Trendelenberg negative Piriformis Test negative Gait normal    Electronically signed by:  Odis Mace [facility] 11:49 AM 10/03/23

## 2023-10-06 ENCOUNTER — Ambulatory Visit: Payer: Self-pay | Admitting: Sports Medicine

## 2023-10-06 LAB — CYCLIC CITRUL PEPTIDE ANTIBODY, IGG: Cyclic Citrullin Peptide Ab: <16 U

## 2023-10-06 LAB — ANA: Anti Nuclear Antibody (ANA): NEGATIVE

## 2023-10-06 LAB — RHEUMATOID FACTOR: Rheumatoid fact SerPl-aCnc: <10 [IU]/mL (ref <14)

## 2023-10-17 ENCOUNTER — Ambulatory Visit: Admitting: Sports Medicine

## 2023-10-30 ENCOUNTER — Other Ambulatory Visit: Payer: Self-pay | Admitting: Sports Medicine

## 2023-10-30 NOTE — Progress Notes (Deleted)
    [facility] [address] [place] [address] Phone: [phone]   Assessment and Plan:     ***    Pertinent previous records reviewed include ***   Follow Up: ***     Subjective:   I, Janalyn Higby, am serving as a Neurosurgeon for Doctor Morene Mace   Chief Complaint: low back pain    HPI:    10/03/23 Patient is a 48 year old female with low back pain. Patient states pain started getting worse a couple of weeks ago but has been here for months. Right sided low back that radiates with numbness down her leg. Tylenol  and ibu for the pain helps enough. Hx of bilat hip replacements. Has started working back out. Decreased ROM. Stretching helps some but the pain is always there. Pain is the worst at the end of the day.    10/31/2023 Patient states  Relevant Historical Information: Bilateral hip replacements  Additional pertinent review of systems negative.   Current Outpatient Medications:    Biotin  5 MG CAPS, Take 5 mg by mouth daily., Disp: , Rfl:    Cholecalciferol  (VITAMIN D ) 50 MCG (2000 UT) tablet, Take 4,000 Units by mouth daily., Disp: , Rfl:    DHEA 50 MG CAPS, Take by mouth., Disp: , Rfl:    DULoxetine  (CYMBALTA ) 30 MG capsule, Take 1 capsule (30 mg total) by mouth daily. TAKE 1 CAPSULE BY MOUTH EVERY DAY, Disp: 90 capsule, Rfl: 3   estradiol (ESTRACE) 2 MG tablet, Take 2 mg by mouth daily., Disp: , Rfl:    ibuprofen  (ADVIL ) 800 MG tablet, Take 1 tablet (800 mg total) by mouth every 8 (eight) hours as needed., Disp: 30 tablet, Rfl: 1   meloxicam  (MOBIC ) 15 MG tablet, Take 1 tablet (15 mg total) by mouth daily., Disp: 30 tablet, Rfl: 0   Multiple Vitamins-Minerals (MULTIVITAMIN WITH MINERALS) tablet, Take 2 tablets by mouth daily. , Disp: , Rfl:    Probiotic Product (PROBIOTIC PO), Take by mouth., Disp: , Rfl:    progesterone (PROMETRIUM) 200 MG capsule, Take 200 mg by mouth daily., Disp: , Rfl:     valACYclovir  (VALTREX ) 500 MG tablet, Take one tablet bid at onset of outbreak for 3 days, Disp: 30 tablet, Rfl: 1   Objective:     There were no vitals filed for this visit.    There is no height or weight on file to calculate BMI.    Physical Exam:    ***   Electronically signed by:  Odis Mace [facility] 7:45 AM 10/30/23

## 2023-10-31 ENCOUNTER — Ambulatory Visit: Admitting: Sports Medicine

## 2024-03-02 ENCOUNTER — Ambulatory Visit (INDEPENDENT_AMBULATORY_CARE_PROVIDER_SITE_OTHER): Admitting: Student

## 2024-03-02 ENCOUNTER — Ambulatory Visit (HOSPITAL_BASED_OUTPATIENT_CLINIC_OR_DEPARTMENT_OTHER)

## 2024-03-02 DIAGNOSIS — S53105A Unspecified dislocation of left ulnohumeral joint, initial encounter: Secondary | ICD-10-CM | POA: Diagnosis not present

## 2024-03-02 NOTE — Progress Notes (Signed)
 "                                Chief Complaint: Left elbow injury    Discussed the use of AI scribe software for clinical note transcription with the patient, who gave verbal consent to proceed.  History of Present Illness Michelle Ewing is a 48 year old female who presents for evaluation and orthopedic aftercare of a left elbow dislocation.  On February 24, 2024, she sustained a left elbow dislocation while attempting to catch her daughter during a fall on a cruise ship. The elbow showed marked deformity with the forearm displaced. She underwent closed reduction under sedation and was placed in a reinforced splint. Post-reduction imaging reportedly showed a reduced joint without fracture, though ligamentous injury was uncertain, and records are unavailable.  She finished prescribed pain medication by December 26 and now uses ibuprofen  for pain and swelling. She has persistent discomfort, worse with arm elevation, and the splint has become more uncomfortable. She notes significant swelling, ecchymosis, and tenderness at the elbow with a heavy sensation. She denies numbness or tingling in the arm except for localized numbness over the medial elbow. She can move the wrist and fingers with tightness and mild weakness and perform basic hand tasks.  She lives alone and manages daily activities but has increased difficulty with tasks like opening pill bottles. 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   TOTAL HIP ARTHROPLASTY  left   TOTAL HIP ARTHROPLASTY Right 05/21/2019   Procedure: RIGHT TOTAL HIP ARTHROPLASTY ANTERIOR APPROACH;  Surgeon: Vernetta Lonni GRADE, MD;  Location: [facility];  Service: Orthopedics;  Laterality: Right;   Social History   Socioeconomic History   Marital status: Single    Spouse name: Not on file   Number of children: Not on file   Years of education: Not on file   Highest education level: Not on file  Occupational History   Not on file  Tobacco Use   Smoking status: Some Days    Types: Cigarettes   Smokeless tobacco: Never   Tobacco comments:    smokes when has a glass of alcohol-rarely  Vaping Use   Vaping status: Never Used  Substance and Sexual Activity   Alcohol use: Yes    Alcohol/week: 0.0 - 2.0 standard drinks of alcohol   Drug use: No   Sexual activity: Not Currently    Partners: Male    Birth control/protection: Abstinence  Other Topics Concern   Not on file  Social History Narrative   Not on file   Social Drivers of Health   Tobacco Use: High Risk (10/03/2023)   Patient History    Smoking Tobacco Use: Some Days    Smokeless Tobacco Use: Never    Passive Exposure: Not on file  Financial Resource Strain: Not on file  Food Insecurity: Not on file  Transportation Needs: Not on file  Physical Activity: Not on file  Stress: Not on file  Social Connections: Unknown (07/17/2021)   [facility]   Social Network    Social Network: Not on file  Depression (PHQ2-9): Not on file  Alcohol Screen: Not on file  Housing: Unknown (06/26/2023)   Received from [facility]   Epic    Unable to Pay for Housing in the Last Year: Not on file    Number of Times Moved in the Last Year: Not on file    At any time in the past 12 months, were you homeless or living in a shelter (including now)?: No  Utilities: Not on file  Health Literacy: Not on file   Family History   Problem Relation Age of Onset   Migraines Maternal Grandmother    Heart disease Maternal Grandmother    Hodgkin's lymphoma Maternal Grandfather    Allergies[1] Current Outpatient Medications  Medication Sig Dispense Refill   Biotin  5 MG CAPS Take 5 mg by mouth daily.     Cholecalciferol  (VITAMIN D ) 50 MCG (2000 UT) tablet Take 4,000 Units by mouth daily.     DHEA 50 MG CAPS Take by mouth.     DULoxetine  (CYMBALTA ) 30 MG capsule Take 1 capsule (30 mg total) by mouth daily. TAKE 1 CAPSULE BY MOUTH EVERY DAY 90 capsule 3   estradiol (ESTRACE) 2 MG tablet Take 2 mg by mouth daily.     ibuprofen  (ADVIL ) 800 MG tablet Take 1 tablet (800 mg total) by mouth every 8 (eight) hours as needed. 30 tablet 1   meloxicam  (MOBIC ) 15 MG tablet Take 1 tablet (15 mg total) by mouth daily. 30 tablet 0   Multiple Vitamins-Minerals (MULTIVITAMIN WITH MINERALS) tablet Take 2 tablets by mouth daily.      Probiotic Product (PROBIOTIC PO) Take by mouth.     progesterone (PROMETRIUM) 200 MG capsule Take 200 mg by mouth daily.     valACYclovir  (VALTREX ) 500 MG tablet Take one tablet bid at onset of outbreak for 3 days 30 tablet 1   No current facility-administered medications for this visit.   No results found.  Review of Systems:   A ROS was performed including pertinent positives and negatives as documented in the HPI.  Physical Exam :   Constitutional: NAD and appears stated age Neurological: Alert and oriented Psych: Appropriate affect and cooperative There were no vitals taken for this visit.   Comprehensive Musculoskeletal Exam:    Exam of the left elbow demonstrates no obvious deformity.  There is significant soft tissue edema with ecchymosis noted in the medial elbow.  Patient can range from approximately 60 to 110 degrees.  No block to motion with pronation and supination.  Distal motor and neurosensory exam is intact.  Imaging:   Xray (left elbow 2 views): Negative for acute fracture or  dislocation   I personally reviewed and interpreted the radiographs.      Assessment & Plan Left elbow dislocation She sustained a left elbow dislocation managed with closed reduction while on vacation [place] country. Imaging shows no displaced fracture but there is concern for ligamentous injury and soft tissue damage. There is limited range of motion with swelling and ecchymosis, but no neurovascular compromise. Early mobilization is critical to prevent stiffness. The overseas cast/splint was removed in the clinic and a hinged brace applied to allow limited motion and prevent stiffness. A stat  MRI was ordered to evaluate for occult fracture, ligamentous injury, and soft tissue damage. She should continue ibuprofen  for analgesia and anti-inflammatory effect and apply ice for swelling. She was instructed to carefully remove the brace for showering and avoid excessive arm use. Monitoring for new numbness, paresthesia, or loss of motion is advised, with instructions to contact the clinic if symptoms occur. A follow-up is scheduled post-MRI to review imaging and determine management. Brace adjustment instructions were provided.     I personally saw and evaluated the patient, and participated in the management and treatment plan.  Leonce Reveal, PA-C Orthopedics      [1]  Allergies Allergen Reactions   Bacitracin     welts   Peroxide [Hydrogen Peroxide]     welt   Benzalkonium Chloride Rash   Neosporin [Neomycin-Bacitracin Zn-Polymyx] Rash   "

## 2024-03-09 ENCOUNTER — Encounter (HOSPITAL_BASED_OUTPATIENT_CLINIC_OR_DEPARTMENT_OTHER): Payer: Self-pay | Admitting: Student

## 2024-03-10 ENCOUNTER — Encounter (HOSPITAL_BASED_OUTPATIENT_CLINIC_OR_DEPARTMENT_OTHER): Payer: Self-pay | Admitting: Student

## 2024-03-11 ENCOUNTER — Ambulatory Visit
Admission: RE | Admit: 2024-03-11 | Discharge: 2024-03-11 | Disposition: A | Discharge status: Home / Self Care | Source: Ambulatory Visit | Attending: Student | Admitting: Student

## 2024-03-11 ENCOUNTER — Other Ambulatory Visit (HOSPITAL_BASED_OUTPATIENT_CLINIC_OR_DEPARTMENT_OTHER): Payer: Self-pay | Admitting: Student

## 2024-03-11 ENCOUNTER — Encounter (HOSPITAL_BASED_OUTPATIENT_CLINIC_OR_DEPARTMENT_OTHER): Payer: Self-pay

## 2024-03-11 DIAGNOSIS — S53105A Unspecified dislocation of left ulnohumeral joint, initial encounter: Secondary | ICD-10-CM

## 2024-03-11 MED ORDER — OXYCODONE HCL 5 MG PO TABS: 5.0000 mg | ORAL_TABLET | ORAL | 0 refills | Status: AC | PRN

## 2024-03-11 NOTE — Progress Notes (Signed)
 Rx sent

## 2024-03-15 ENCOUNTER — Ambulatory Visit (INDEPENDENT_AMBULATORY_CARE_PROVIDER_SITE_OTHER): Admitting: Student

## 2024-03-15 DIAGNOSIS — S53105D Unspecified dislocation of left ulnohumeral joint, subsequent encounter: Secondary | ICD-10-CM | POA: Diagnosis not present

## 2024-03-15 NOTE — Progress Notes (Unsigned)
 "                                Chief Complaint: Left elbow injury    History of Present Illness  03/15/24: Patient presents today for MRI review of the left elbow.  Unfortunately she states that due to prolonged time and positioning from the initial MRI attempt, she has had some increased pain.  Prior to this, she was tolerating everything very well and had been going into work.  She has continued use of the elbow brace.  She takes Advil  as needed for pain.   03/02/24: Michelle Ewing is a 49 year old female who presents for evaluation and orthopedic aftercare of a left elbow dislocation. On February 24, 2024, she sustained a left elbow dislocation while attempting to catch her daughter during a fall on a cruise ship. The elbow showed marked deformity with the forearm displaced. She underwent closed reduction under sedation and was placed in a reinforced splint. Post-reduction imaging reportedly showed a reduced joint without fracture, though ligamentous injury was uncertain, and records are unavailable. She finished prescribed pain medication by December 26 and now uses ibuprofen  for pain and swelling. She has persistent discomfort, worse with arm elevation, and the splint has become more uncomfortable. She notes significant swelling, ecchymosis, and tenderness at the elbow with a heavy sensation. She denies numbness or tingling in the arm except for localized numbness over the medial elbow. She can move the wrist and fingers with tightness and mild weakness and perform basic hand tasks. She lives alone and manages daily activities but has increased difficulty with tasks like opening pill bottles. 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   TOTAL HIP ARTHROPLASTY  left   TOTAL HIP ARTHROPLASTY Right 05/21/2019   Procedure: RIGHT TOTAL HIP ARTHROPLASTY ANTERIOR APPROACH;  Surgeon: Vernetta Lonni GRADE, MD;  Location: [facility];  Service: Orthopedics;  Laterality: Right;   Social History   Socioeconomic History   Marital status: Single    Spouse name: Not on file   Number of children: Not on file   Years of education: Not on file   Highest education level: Not on file  Occupational History   Not on file  Tobacco Use   Smoking status: Some Days    Types: Cigarettes   Smokeless tobacco: Never   Tobacco comments:    smokes when has a glass of alcohol-rarely  Vaping Use   Vaping status: Never Used  Substance and Sexual Activity   Alcohol use: Yes    Alcohol/week: 0.0 - 2.0 standard drinks of alcohol   Drug use: No   Sexual activity: Not Currently    Partners: Male    Birth control/protection: Abstinence  Other Topics Concern   Not on file  Social History Narrative   Not on file  Social Drivers of Health   Tobacco Use: High Risk (10/03/2023)   Patient History    Smoking Tobacco Use: Some Days    Smokeless Tobacco Use: Never    Passive Exposure: Not on file  Financial Resource Strain: Not on file  Food Insecurity: Not on file  Transportation Needs: Not on file  Physical Activity: Not on file  Stress: Not on file  Social Connections: Unknown (07/17/2021)   Received [facility]   Social Network    Social Network: Not on file  Depression (PHQ2-9): Not on file  Alcohol Screen: Not on file  Housing: Unknown (06/26/2023)   Received from [facility]   Epic    Unable to Pay for Housing in the Last Year: Not on  file    Number of Times Moved in the Last Year: Not on file    At any time in the past 12 months, were you homeless or living in a shelter (including now)?: No  Utilities: Not on file  Health Literacy: Not on file   Family History  Problem Relation Age of Onset   Migraines Maternal Grandmother    Heart disease Maternal Grandmother    Hodgkin's lymphoma Maternal Grandfather    Allergies[1] Current Outpatient Medications  Medication Sig Dispense Refill   Biotin  5 MG CAPS Take 5 mg by mouth daily.     Cholecalciferol  (VITAMIN D ) 50 MCG (2000 UT) tablet Take 4,000 Units by mouth daily.     DHEA 50 MG CAPS Take by mouth.     DULoxetine  (CYMBALTA ) 30 MG capsule Take 1 capsule (30 mg total) by mouth daily. TAKE 1 CAPSULE BY MOUTH EVERY DAY 90 capsule 3   estradiol (ESTRACE) 2 MG tablet Take 2 mg by mouth daily.     ibuprofen  (ADVIL ) 800 MG tablet Take 1 tablet (800 mg total) by mouth every 8 (eight) hours as needed. 30 tablet 1   meloxicam  (MOBIC ) 15 MG tablet Take 1 tablet (15 mg total) by mouth daily. 30 tablet 0   Multiple Vitamins-Minerals (MULTIVITAMIN WITH MINERALS) tablet Take 2 tablets by mouth daily.      oxyCODONE  (ROXICODONE ) 5 MG immediate release tablet Take 1 tablet (5 mg total) by mouth every 4 (four) hours as needed for severe pain (pain score 7-10) or breakthrough pain. 5 tablet 0   Probiotic Product (PROBIOTIC PO) Take by mouth.     progesterone (PROMETRIUM) 200 MG capsule Take 200 mg by mouth daily.     valACYclovir  (VALTREX ) 500 MG tablet Take one tablet bid at onset of outbreak for 3 days 30 tablet 1   No current facility-administered medications for this visit.   No results found.  Review of Systems:   A ROS was performed including pertinent positives and negatives as documented in the HPI.  Physical Exam :   Constitutional: NAD and appears stated age Neurological: Alert and oriented Psych: Appropriate affect and cooperative There were no vitals taken for this  visit.   Comprehensive Musculoskeletal Exam:    Left elbow demonstrates presence of mild diffuse swelling and nearly resolved ecchymosis.  Active range of motion of the elbow is from 20 to 100 degrees.  There is a visible valgus deformity with tenderness over the medial epicondyle.  Active wrist flexion and extension intact.  Radial pulse 2+.  Intact sensation distally.  Imaging:   MRI left elbow 03/11/24:  IMPRESSION: 1. Osseous contusions of the radial head and capitellum with associated marrow edema. The radiocapitellar and  ulnotrochlear joints are aligned. 2. Complete tear of the common forearm flexor tendon origin from the medial humeral epicondyle with up to 2 cm of retraction and joint fluid decompressing into the overlying subcutaneous tissues of the medial elbow. Associated intramuscular edema extending along the proximal forearm flexor myotendinous junction. Marked surrounding soft tissue and subcutaneous edema at the medial elbow. 3. Complete tear of the posterior bundle of the ulnar collateral ligament. High-grade versus near near-complete tear of the anterior bundle of the ulnar collateral ligament. 4. Partial-thickness tear of the common forearm extensor tendon origin from the lateral humeral epicondyle with areas of more pronounced fiber disruption. Associated edema at the myotendinous junction. 5. High-grade partial tear of the radial collateral ligament at the lateral humeral epicondyle origin. Tear of the origin of the lateral ulnar collateral ligament. 6. Moderate-to-large elbow joint effusion. 7. Low-grade sprain of the extensor carpi radialis longus muscle.     Assessment & Plan Left elbow dislocation     She sustained a left elbow dislocation managed with closed reduction while on vacation out of the country. Imaging shows no displaced fracture but there is concern for ligamentous injury and soft tissue damage. There is limited range of motion with swelling  and ecchymosis, but no neurovascular compromise. Early mobilization is critical to prevent stiffness. The overseas cast/splint was removed in the clinic and a hinged brace applied to allow limited motion and prevent stiffness. A stat MRI was ordered to evaluate for occult fracture, ligamentous injury, and soft tissue damage. She should continue ibuprofen  for analgesia and anti-inflammatory effect and apply ice for swelling. She was instructed to carefully remove the brace for showering and avoid excessive arm use. Monitoring for new numbness, paresthesia, or loss of motion is advised, with instructions to contact the clinic if symptoms occur. A follow-up is scheduled post-MRI to review imaging and determine management. Brace adjustment instructions were provided.     I personally saw and evaluated the patient, and participated in the management and treatment plan.  Leonce Reveal, PA-C Orthopedics      [1]  Allergies Allergen Reactions   Bacitracin     welts   Peroxide [Hydrogen Peroxide]     welt   Benzalkonium Chloride Rash   Neosporin [Neomycin-Bacitracin Zn-Polymyx] Rash   "

## 2024-03-18 ENCOUNTER — Ambulatory Visit: Admitting: Rehabilitative and Restorative Service Providers"

## 2024-03-18 ENCOUNTER — Encounter: Payer: Self-pay | Admitting: Rehabilitative and Restorative Service Providers"

## 2024-03-18 DIAGNOSIS — M6281 Muscle weakness (generalized): Secondary | ICD-10-CM

## 2024-03-18 DIAGNOSIS — M25522 Pain in left elbow: Secondary | ICD-10-CM

## 2024-03-18 DIAGNOSIS — R278 Other lack of coordination: Secondary | ICD-10-CM | POA: Diagnosis not present

## 2024-03-18 DIAGNOSIS — R600 Localized edema: Secondary | ICD-10-CM

## 2024-03-18 DIAGNOSIS — M25622 Stiffness of left elbow, not elsewhere classified: Secondary | ICD-10-CM

## 2024-03-18 NOTE — Therapy (Signed)
 " OUTPATIENT OCCUPATIONAL THERAPY ORTHO EVALUATION  Patient Name: Michelle Ewing MRN: [id number] DOB:01-22-1976, 49 y.o., female Today's Date: 03/18/2024  PCP: N/A REFERRING PROVIDER: Emiliano PARAS. PA-C  END OF SESSION:  OT End of Session - 03/18/24 1441     Visit Number 1    Number of Visits 7    Date for Recertification  04/30/24    Authorization Type [employer]    OT Start Time 1442    OT Stop Time 1540    OT Time Calculation (min) 58 min    Activity Tolerance Patient tolerated treatment well;No increased pain;Patient limited by fatigue;Patient limited by pain    Behavior During Therapy [facility] for tasks assessed/performed          Past Medical History:  Diagnosis Date   Aftercare following left hip joint replacement surgery 11/17/2015   Anxiety 2014   Arthritis    ASCUS with positive high risk HPV    Depression    Exercise-induced asthma    Migraine with aura    Smoker    Past Surgical History:  Procedure Laterality Date   COLPOSCOPY  11/28/2010   With hx CIN , 2014 LGSIL   TOTAL HIP ARTHROPLASTY  left   TOTAL HIP ARTHROPLASTY Right 05/21/2019   Procedure: RIGHT TOTAL HIP ARTHROPLASTY ANTERIOR APPROACH;  Surgeon: Vernetta Lonni GRADE, MD;  Location: [facility];  Service: Orthopedics;  Laterality: Right;   Patient Active Problem List   Diagnosis Date Noted   Status post total replacement of right hip 05/21/2019   Unilateral primary osteoarthritis, right hip 04/27/2019   Follow-up examination after orthopedic surgery 11/05/2016   Aftercare following left hip joint replacement surgery 11/17/2015   History of total left hip arthroplasty 11/17/2015   Status post left hip replacement 10/31/2015   Migraine 10/18/2015   Primary osteoarthritis of left hip 09/28/2015    ONSET DATE: DOI 02/24/24  REFERRING DIAG: S53.105D (ICD-10-CM) - Dislocation of left elbow, subsequent encounter   THERAPY DIAG:  Localized edema  Muscle weakness (generalized)  Other lack of  coordination  Pain in left elbow  Stiffness of left elbow, not elsewhere classified  Rationale for Evaluation and Treatment: Rehabilitation  SUBJECTIVE:   SUBJECTIVE STATEMENT: She is now 3 weeks s/p Lt elbow dislocation.  She fell on a cruise in Dec, had closed reduction of the Lt elbow. She states having exacerbation of pain when having MRI taken recently.  MRI did show multiple significant injuries, but fortunately she has very little pain, is tolerating motion, has been mild amounts of tenderness and a good attitude.  She works with community education officer, mostly computer based.  She is wearing a hinged elbow brace today that seems to limit full extension by about 15 degrees   PERTINENT HISTORY: Lt THA, OA, asthma, etc.  X-ray on 03/14/24 showed possible non-displaced fx to humeral supracondylar region.   MRI from 03/11/24 shows: MRI left elbow 03/11/24:  IMPRESSION: 1. Osseous contusions of the radial head and capitellum with associated marrow edema. The radiocapitellar and ulnotrochlear joints are aligned. 2. Complete tear of the common forearm flexor tendon origin from the medial humeral epicondyle with up to 2 cm of retraction and joint fluid decompressing into the overlying subcutaneous tissues of the medial elbow. Associated intramuscular edema extending along the proximal forearm flexor myotendinous junction. Marked surrounding soft tissue and subcutaneous edema at the medial elbow. 3. Complete tear of the posterior bundle of the ulnar collateral ligament. High-grade versus near near-complete tear of the anterior bundle of the  ulnar collateral ligament. 4. Partial-thickness tear of the common forearm extensor tendon origin from the lateral humeral epicondyle with areas of more pronounced fiber disruption. Associated edema at the myotendinous junction. 5. High-grade partial tear of the radial collateral ligament at the lateral humeral epicondyle origin. Tear of the origin of the  lateral ulnar collateral ligament. 6. Moderate-to-large elbow joint effusion. 7. Low-grade sprain of the extensor carpi radialis longus muscle.   PRECAUTIONS: Use caution with any valgus or varus motions of the elbow due to ligament tears, and take care not to injure common flexor origin further  RED FLAGS: None   WEIGHT BEARING RESTRICTIONS: Yes recommended no weightbearing more than 5 pounds with her brace on, or anything that is painful to her  PAIN:  Are you having pain? Yes: NPRS scale: 0/10 at rest, up to 6/10 at worst after MRI testing in past week  Pain location: Rt medial elbow, lateral elbow Pain description: sore, aching, tight  Aggravating factors: wrist motion, elbow ext  Relieving factors: Rest  FALLS: Has patient fallen in last 6 months? Yes. Number of falls 1 - this accident, not a fall risk   LIVING ENVIRONMENT: Lives with: lives alone Lives in: House/apartment Has following equipment at home: None  PLOF: Independent  PATIENT GOALS: To safely improve her motion and strength with the left arm  NEXT MD VISIT: Approximately 4 weeks  OBJECTIVE: (All objective assessments below are from initial evaluation on: 03/18/24 unless otherwise specified.)   HAND DOMINANCE: Right   ADLs: Overall ADLs: States decreased ability to grab, hold household objects, pain and difficulty to open containers, perform FMS tasks (manipulate fasteners on clothing), mild to moderate bathing problems as well.    FUNCTIONAL OUTCOME MEASURES: Eval: Patient Specific Functional Scale: 2.6 (earrings, hold laundry basket, put up hair)  (Higher Score  =  Better Ability for the Selected Tasks)      UPPER EXTREMITY ROM     Shoulder to Wrist AROM Left eval  Shoulder flexion WFL but feels stiff  Shoulder abduction   Shoulder extension   Shoulder internal rotation   Shoulder external rotation   Elbow flexion 126  Elbow extension (-25)  Forearm supination 80  Forearm pronation  83   Wrist flexion 73  Wrist extension 70  Wrist ulnar deviation   Wrist radial deviation   Functional dart thrower's motion (F-DTM) in ulnar flexion   F-DTM in radial extension    (Blank rows = not tested)   Hand AROM Left eval  Full Fist Ability (or Gap to Distal Palmar Crease) Full fist  Thumb Opposition  (Kapandji Scale)  Full opposition  (Blank rows = not tested)   UPPER EXTREMITY MMT:    Eval:  NT at eval due to recent and still healing injuries. Will be tested when appropriate.   MMT Left TBD  Shoulder flexion   Shoulder abduction   Shoulder adduction   Shoulder extension   Shoulder internal rotation   Shoulder external rotation   Middle trapezius   Lower trapezius   Elbow flexion   Elbow extension   Forearm supination   Forearm pronation   Wrist flexion   Wrist extension   Wrist ulnar deviation   Wrist radial deviation   (Blank rows = not tested)  HAND FUNCTION: Eval: Observed weakness in affected Lt hand.  TBD when safe  Grip strength Right: TBD lbs, Left: TBD lbs   COORDINATION: Eval: Observed coordination impairments with affected left hand/arm, details TBD in the next  session as time allows. Box and Blocks Test: Left: TBD blocks today  SENSATION: Eval:  Light touch intact today,   no complaints of numbness or paresthesia throughout the left arm  EDEMA:   Eval:  Mildly swollen in left elbow medially today, compared to the right  COGNITION: Eval: Overall cognitive status: WFL for evaluation today   OBSERVATIONS:   Eval: Bruising and ecchymosis to the left medial elbow and tenderness there where there is a complete tear.  Tender to palpation on the lateral epicondyle as well.  Not grossly unstable with valgus or varus force today though mildly painful.  Hand and wrist is moving amazingly well with the wrist flexion with slight tension and resistance is painful.  Shoulder is a bit stiff likely from the fall and also wearing the hinged brace with her arm  at her side along time.  Left elbow dorsal dislocation and complete tear of common flexor origin, RCL, and partial tears of lateral epicondyle and lateral collateral ligament   TODAY'S TREATMENT:  Post-evaluation treatment:   OT provides her with the following home exercise program to work on active, active assistive and gentle passive range of motion at the shoulder, elbow, forearm and wrist.  They are done to the patient's tolerance with light tension and only very mild pain symptoms-she was strongly recommended to not cause any sharp pain or pain above 4-5/10.  It was strongly recommended that she spend the next 2 to 4 weeks resting and healing, while doing gentle exercises 4-6 times a day to prevent stiffness as she continues to heal.  OT spent a good deal of time talking about safety and self-care, nonweightbearing, encouraging gentle functional ability while wearing her brace.  She can carefully shower outside of her brace, and she can do her exercises outside of her brace as long as she is not painful.  She was shown how to perform them in supine also standing as tolerated.  Different variations of these exercises were also shown-and she was recommended to use moist heat around the shoulder or elbow for 5 minutes before beginning to add blood flow and help with tissue extensibility.  She can use ice anytime she feels more swollen or tender.  She states understanding all of these directions, tolerates exercises and activities fairly well today, leaves without significant pain.  Exercises - Supine Shoulder Flexion PROM  - 4-6 x daily - 3-5 reps - 15 sec hold - Supine Elbow Flexion Extension AROM  - 4-6 x daily - 1 sets - 10-15 reps - Seated Bicep Curls with Bar  - 4-6 x daily - 10-15 reps - Turn [employer] Facing Up & Down  - 4-6 x daily - 10-15 reps - Bend and Pull Back Wrist SLOWLY  - 4-6 x daily - 10-15 reps - Windshield Wipers   - 4 x daily - 10-15 reps  PATIENT EDUCATION: Education details:  See tx section above for details  Person educated: Patient Education method: Engineer, Structural, Teach back, Handouts  Education comprehension: States and demonstrates understanding, Additional Education required    HOME EXERCISE PROGRAM: Access Code: [email or web address] URL: [email or web address] Date: 03/18/2024 Prepared by: Melvenia Ada   GOALS: Goals reviewed with patient? Yes   SHORT TERM GOALS: (STG required if POC>30 days) Target Date: 04/02/2024  Pt will obtain custom mobilization orthotic. Goal status: TBD/PRN  2.  Pt will demo/state understanding of initial HEP to improve pain levels and prerequisite motion. Goal status: INITIAL   LONG TERM GOALS: Target Date:  04/30/2024  Pt will improve functional ability by decreased impairment per PSFS assessment from 2.6 to 6.5 or better, for better quality of life. Goal status: INITIAL  2.  Pt will improve grip strength in left hand from unsafe to test to at least 40 lbs for functional use at home and in IADLs. Goal status: INITIAL  3.  Pt will improve A/ROM in left elbow flexion/extension from 126/-26 degrees respectively to at least 150/-5 degrees respectively, to have functional motion for tasks like reach and grasp.  Goal status: INITIAL  4.  Pt will improve strength in left wrist flexion from apparent tender 3+/5 MMT to at least 4+/5 MMT to have increased functional ability to carry out selfcare and higher-level homecare tasks with less difficulty. Goal status: INITIAL  5.  Pt will improve coordination skills in left arm, as seen by within functional limit score on box and blocks testing to have increased functional ability to carry out fine motor tasks (fasteners, etc.) and more complex, coordinated IADLs (meal prep, sports, etc.).  Goal status: INITIAL  6.  Pt will keep pain at worst to 3/10 or better to have better sleep and occupational participation in daily roles. Goal status:  INITIAL   ASSESSMENT:  CLINICAL IMPRESSION: Patient is a 48y.o. female who was seen today for occupational therapy evaluation for weakness, stiffness, pain, swelling, decreased functional ability in the left elbow and arm after dislocation of the elbow and circumferential polytrauma with complete tearing of ligaments and tendons all about the left elbow.  This was somewhat complicated evaluation because of the many injuries she sustained.  OT used caution when testing stability and motion to ensure no damage would come to the patient.  Fortunately, she is tolerating everything very well despite her serious injuries and it seems to be a sign of good healing.  The patient will benefit from outpatient occupational therapy to decrease symptoms, improve functional upper extremity use, and increase quality of life.  PERFORMANCE DEFICITS: in functional skills including ADLs, IADLs, coordination, proprioception, edema, ROM, strength, pain, fascial restrictions, flexibility, Gross motor control, body mechanics, endurance, and UE functional use, cognitive skills including problem solving and safety awareness, and psychosocial skills including coping strategies, environmental adaptation, habits, and routines and behaviors.   IMPAIRMENTS: are limiting patient from ADLs, IADLs, rest and sleep, and leisure.   COMORBIDITIES: may have co-morbidities  that affects occupational performance. Patient will benefit from skilled OT to address above impairments and improve overall function.  MODIFICATION OR ASSISTANCE TO COMPLETE EVALUATION: Min-Moderate modification of tasks or assist with assess necessary to complete an evaluation.  OT OCCUPATIONAL PROFILE AND HISTORY: Detailed assessment: Review of records and additional review of physical, cognitive, psychosocial history related to current functional performance.  CLINICAL DECISION MAKING: Moderate - several treatment options, min-mod task modification  necessary  REHAB POTENTIAL: Good  EVALUATION COMPLEXITY: Moderate      PLAN:  OT FREQUENCY: 1x/week  OT DURATION: 6 weeks through 04/30/2024 and up to 7 total visits as needed   PLANNED INTERVENTIONS: 97535 self care/ADL training, [id number] therapeutic exercise, 97530 therapeutic activity, 97112 neuromuscular re-education, 97140 manual therapy, 97035 ultrasound, 97032 electrical stimulation (manual), 97760 Orthotic Initial, [id number] Orthotic/Prosthetic subsequent, compression bandaging, Dry needling, energy conservation, coping strategies training, and patient/family education  RECOMMENDED OTHER SERVICES: none now    CONSULTED AND AGREED WITH PLAN OF CARE: Patient  PLAN FOR NEXT SESSION:   Review initial HEP and recommendations, continue to advance mobilizations into slightly more aggressive passive range of  motion and active range of motion as the healing process continues.  Use caution for full-thickness tears and trauma that are at risk for further injury if not careful.  She is doing well but she should not push herself until the healing process is more complete   Melvenia Ada, OTR/L, CHT  03/18/2024, 5:16 PM   "

## 2024-03-24 NOTE — Therapy (Signed)
 " OUTPATIENT OCCUPATIONAL THERAPY TREATMENT NOTE  Patient Name: Michelle Ewing MRN: [id number] DOB:Sep 18, 1975, 49 y.o., female Today's Date: 03/25/2024  PCP: N/A REFERRING PROVIDER: Emiliano Ewing. PA-C  END OF SESSION:  OT End of Session - 03/25/24 1427     Visit Number 2    Number of Visits 7    Date for Recertification  04/30/24    Authorization Type [employer]    OT Start Time 1427    OT Stop Time 1511    OT Time Calculation (min) 44 min    Activity Tolerance Patient tolerated treatment well;No increased pain;Patient limited by fatigue;Patient limited by pain    Behavior During Therapy [facility] for tasks assessed/performed           Past Medical History:  Diagnosis Date   Aftercare following left hip joint replacement surgery 11/17/2015   Anxiety 2014   Arthritis    ASCUS with positive high risk HPV    Depression    Exercise-induced asthma    Migraine with aura    Smoker    Past Surgical History:  Procedure Laterality Date   COLPOSCOPY  11/28/2010   With hx CIN , 2014 LGSIL   TOTAL HIP ARTHROPLASTY  left   TOTAL HIP ARTHROPLASTY Right 05/21/2019   Procedure: RIGHT TOTAL HIP ARTHROPLASTY ANTERIOR APPROACH;  Surgeon: Michelle Lonni GRADE, MD;  Location: [facility];  Service: Orthopedics;  Laterality: Right;   Patient Active Problem List   Diagnosis Date Noted   Status post total replacement of right hip 05/21/2019   Unilateral primary osteoarthritis, right hip 04/27/2019   Follow-up examination after orthopedic surgery 11/05/2016   Aftercare following left hip joint replacement surgery 11/17/2015   History of total left hip arthroplasty 11/17/2015   Status post left hip replacement 10/31/2015   Migraine 10/18/2015   Primary osteoarthritis of left hip 09/28/2015    ONSET DATE: DOI 02/24/24  REFERRING DIAG: S53.105D (ICD-10-CM) - Dislocation of left elbow, subsequent encounter   THERAPY DIAG:  Localized edema  Muscle weakness (generalized)  Other lack of  coordination  Pain in left elbow  Stiffness of left elbow, not elsewhere classified  Rationale for Evaluation and Treatment: Rehabilitation  PERTINENT HISTORY: Lt THA, OA, asthma, etc.  X-ray on 03/14/24 showed possible non-displaced fx to humeral supracondylar region.   MRI from 03/11/24 shows: MRI left elbow 03/11/24:  IMPRESSION: 1. Osseous contusions of the radial head and capitellum with associated marrow edema. The radiocapitellar and ulnotrochlear joints are aligned. 2. Complete tear of the common forearm flexor tendon origin from the medial humeral epicondyle with up to 2 cm of retraction and joint fluid decompressing into the overlying subcutaneous tissues of the medial elbow. Associated intramuscular edema extending along the proximal forearm flexor myotendinous junction. Marked surrounding soft tissue and subcutaneous edema at the medial elbow. 3. Complete tear of the posterior bundle of the ulnar collateral ligament. High-Ewing versus near near-complete tear of the anterior bundle of the ulnar collateral ligament. 4. Partial-thickness tear of the common forearm extensor tendon origin from the lateral humeral epicondyle with areas of more pronounced fiber disruption. Associated edema at the myotendinous junction. 5. High-Ewing partial tear of the radial collateral ligament at the lateral humeral epicondyle origin. Tear of the origin of the lateral ulnar collateral ligament. 6. Moderate-to-large elbow joint effusion. 7. Low-Ewing sprain of the extensor carpi radialis longus muscle.   PRECAUTIONS: Use caution with any valgus or varus motions of the elbow due to ligament tears, and take care not to injure  common flexor origin further  She fell on a cruise in Dec, had closed reduction of the Lt elbow. She states having exacerbation of pain when having MRI taken recently.  MRI did show multiple significant injuries, but fortunately she has very little pain, is tolerating  motion, has been mild amounts of tenderness and a good attitude.  She works with community education officer, mostly computer based.  She is wearing a hinged elbow brace today that seems to limit full extension by about 15 degrees   RED FLAGS: None   WEIGHT BEARING RESTRICTIONS: Yes recommended no weightbearing more than 5 pounds with her brace on, or anything that is painful to her     SUBJECTIVE:   SUBJECTIVE STATEMENT: She is now 4 weeks s/p Lt elbow dislocation.  She states she is wearing her elbow brace more often because she got nervous, she also had moments with some sharp pain and has decided to wear it in the night and wear it more in the day.  Overall she has less pain at worst in the past week and still no significant pain at rest today.[name]     PAIN:  Are you having pain? Yes: NPRS scale: 0/10 at rest, up to 3-4 /10 at worst in past week  Pain location: Lt medial elbow, lateral elbow Pain description: sore, aching, tight  Aggravating factors: wrist motion, elbow ext  Relieving factors: Rest   PATIENT GOALS: To safely improve her motion and strength with the left arm  NEXT MD VISIT: Approximately 4 weeks  OBJECTIVE: (All objective assessments below are from initial evaluation on: 03/18/24 unless otherwise specified.)   HAND DOMINANCE: Right   ADLs: Overall ADLs: States decreased ability to grab, hold household objects, pain and difficulty to open containers, perform FMS tasks (manipulate fasteners on clothing), mild to moderate bathing problems as well.    FUNCTIONAL OUTCOME MEASURES: Eval: Patient Specific Functional Scale: 2.6 (earrings, hold laundry basket, put up hair)  (Higher Score  =  Better Ability for the Selected Tasks)      UPPER EXTREMITY ROM     Shoulder to Wrist AROM Left eval Lt 03/25/24  Shoulder flexion WFL but feels stiff   Shoulder abduction    Shoulder extension    Shoulder internal rotation    Shoulder external rotation    Elbow flexion 126 124  Elbow  extension (-25) (-35)  Forearm supination 80 78  Forearm pronation  83 85  Wrist flexion 73   Wrist extension 70   Wrist ulnar deviation    Wrist radial deviation    Functional dart thrower's motion (F-DTM) in ulnar flexion    F-DTM in radial extension     (Blank rows = not tested)   Hand AROM Left eval  Full Fist Ability (or Gap to Distal Palmar Crease) Full fist  Thumb Opposition  (Kapandji Scale)  Full opposition  (Blank rows = not tested)   UPPER EXTREMITY MMT:    Eval:  NT at eval due to recent and still healing injuries. Will be tested when appropriate.   MMT Left TBD  Shoulder flexion   Shoulder abduction   Shoulder adduction   Shoulder extension   Shoulder internal rotation   Shoulder external rotation   Middle trapezius   Lower trapezius   Elbow flexion   Elbow extension   Forearm supination   Forearm pronation   Wrist flexion   Wrist extension   Wrist ulnar deviation   Wrist radial deviation   (Blank rows =  not tested)  HAND FUNCTION: Eval: Observed weakness in affected Lt hand.  TBD when safe  Grip strength Right: TBD lbs, Left: TBD lbs   COORDINATION: 03/25/24: Box and Blocks Test: Left: 51 blocks today (53 is WFL)    EDEMA:   03/25/24:  TBD cm swelling about the Lt elbow crease compared to TBD cm in the right.    OBSERVATIONS:   Eval: Bruising and ecchymosis to the left medial elbow and tenderness there where there is a complete tear.  Tender to palpation on the lateral epicondyle as well.  Not grossly unstable with valgus or varus force today though mildly painful.  Hand and wrist is moving amazingly well with the wrist flexion with slight tension and resistance is painful.  Shoulder is a bit stiff likely from the fall and also wearing the hinged brace with her arm at her side along time.  Left elbow dorsal dislocation and complete tear of common flexor origin, RCL, and partial tears of lateral epicondyle and lateral collateral  ligament   TODAY'S TREATMENT:  03/25/24: She starts with active range of motion for exercise as well as new measures which shows a slightly tighter at the elbow and forearm likely due to wearing her brace more often.  This is to be expected and not necessarily a bad thing.  OT tells her that she can carefully monitor her her pain and the frequency of exercises and perhaps get the arm a bit looser with increased frequency as long as pain is remaining low.  We reviewed safety precautions in terms of no significant weightbearing, but light functional activities in the brace and even outside of the brace are encouraged as long as there is no pain at the elbow.  She does box of blocks testing today showing that she is almost in a functional limitation score already.   She then lies supine and OT does retrograde massage to help with swelling and edema, explaining that she could start her workouts with this.  She then reviews gentle active range of motion with a towel under the humerus using forearm rotations.  She then performs gentle gravity assisted active assisted range of motion.  We then move onto dowel rod active assistive range of motion including shoulder flexion and extension, chest presses, and then she performs with a ball to have neutral forearm position.  She tolerates gentle tricep extension against gravity with ball support.  She then does the standing for gentle bicep active range of motion against gravity with gentle isometric bicep squeeze and chest squeeze gently pressing on the ball.  She has no significant pain with these, but rather feels healthy movement and gentle stretches.  Lastly she states she is a bit nervous to do any walking on the treadmill, so OT takes her on the treadmill with her brace on for safety and support and she walks at a comfortable pace allowing her arm to swing gently without pain.  OT encourages light cardiovascular exercise as this will help her healing and swelling  most likely.  She states understanding all directions today, tolerates exercises and activities well and leaves with no significant pain.  PATIENT EDUCATION: Education details: See tx section above for details  Person educated: Patient Education method: Verbal Instruction, Teach back, Handouts  Education comprehension: States and demonstrates understanding, Additional Education required    HOME EXERCISE PROGRAM: Access Code: [email or web address] URL: [email or web address] Date: 03/18/2024 Prepared by: Melvenia Ada   GOALS: Goals reviewed with patient? Yes   SHORT  TERM GOALS: (STG required if POC>30 days) Target Date: 04/02/2024  Pt will obtain custom mobilization orthotic. Goal status: TBD/PRN  2.  Pt will demo/state understanding of initial HEP to improve pain levels and prerequisite motion. Goal status: INITIAL   LONG TERM GOALS: Target Date: 04/30/2024  Pt will improve functional ability by decreased impairment per PSFS assessment from 2.6 to 6.5 or better, for better quality of life. Goal status: INITIAL  2.  Pt will improve grip strength in left hand from unsafe to test to at least 40 lbs for functional use at home and in IADLs. Goal status: INITIAL  3.  Pt will improve A/ROM in left elbow flexion/extension from 126/-26 degrees respectively to at least 150/-5 degrees respectively, to have functional motion for tasks like reach and grasp.  Goal status: INITIAL  4.  Pt will improve strength in left wrist flexion from apparent tender 3+/5 MMT to at least 4+/5 MMT to have increased functional ability to carry out selfcare and higher-level homecare tasks with less difficulty. Goal status: INITIAL  5.  Pt will improve coordination skills in left arm, as seen by within functional limit score on box and blocks testing to have increased functional ability to carry out fine motor tasks (fasteners, etc.) and more complex, coordinated IADLs (meal prep, sports, etc.).  Goal  status: INITIAL  6.  Pt will keep pain at worst to 3/10 or better to have better sleep and occupational participation in daily roles. Goal status: INITIAL   ASSESSMENT:  CLINICAL IMPRESSION: 03/25/24: Tolerating supine and standing active/active assistive/gentle isometrics against gravity to help maintain motion, have appropriate motion and stretches to the shoulder, elbow and very mildly at the forearm as well.  Eval: Patient is a 48y.o. female who was seen today for occupational therapy evaluation for weakness, stiffness, pain, swelling, decreased functional ability in the left elbow and arm after dislocation of the elbow and circumferential polytrauma with complete tearing of ligaments and tendons all about the left elbow.  This was somewhat complicated evaluation because of the many injuries she sustained.  OT used caution when testing stability and motion to ensure no damage would come to the patient.  Fortunately, she is tolerating everything very well despite her serious injuries and it seems to be a sign of good healing.  The patient will benefit from outpatient occupational therapy to decrease symptoms, improve functional upper extremity use, and increase quality of life.   PLAN:  OT FREQUENCY: 1x/week  OT DURATION: 6 weeks through 04/30/2024 and up to 7 total visits as needed   PLANNED INTERVENTIONS: 97535 self care/ADL training, 02889 therapeutic exercise, 97530 therapeutic activity, 97112 neuromuscular re-education, 97140 manual therapy, 97035 ultrasound, 97032 electrical stimulation (manual), 97760 Orthotic Initial, [id number] Orthotic/Prosthetic subsequent, compression bandaging, Dry needling, energy conservation, coping strategies training, and patient/family education  CONSULTED AND AGREED WITH PLAN OF CARE: Patient  PLAN FOR NEXT SESSION:   Continue with safety precautions, but consider adding gentle isometric gripping.  Add gentle proprioceptive activities at the elbow, forearm  and hand as needed.  Continue with manual therapy or edema reduction techniques as helpful.   Melvenia Ada, OTR/L, CHT  03/25/2024, 3:20 PM   "

## 2024-03-25 ENCOUNTER — Ambulatory Visit: Admitting: Rehabilitative and Restorative Service Providers"

## 2024-03-25 ENCOUNTER — Encounter: Payer: Self-pay | Admitting: Rehabilitative and Restorative Service Providers"

## 2024-03-25 DIAGNOSIS — R600 Localized edema: Secondary | ICD-10-CM

## 2024-03-25 DIAGNOSIS — M25522 Pain in left elbow: Secondary | ICD-10-CM | POA: Diagnosis not present

## 2024-03-25 DIAGNOSIS — R278 Other lack of coordination: Secondary | ICD-10-CM | POA: Diagnosis not present

## 2024-03-25 DIAGNOSIS — M6281 Muscle weakness (generalized): Secondary | ICD-10-CM

## 2024-03-25 DIAGNOSIS — M25622 Stiffness of left elbow, not elsewhere classified: Secondary | ICD-10-CM

## 2024-03-31 NOTE — Therapy (Signed)
 " OUTPATIENT OCCUPATIONAL THERAPY TREATMENT NOTE  Patient Name: Michelle Ewing MRN: [id number] DOB:1975/06/24, 49 y.o., female Today's Date: 04/01/2024  PCP: N/A REFERRING PROVIDER: Emiliano PARAS. PA-C  END OF SESSION:  OT End of Session - 04/01/24 1429     Visit Number 3    Number of Visits 7    Date for Recertification  04/30/24    Authorization Type [employer]    OT Start Time 1430    OT Stop Time 1515    OT Time Calculation (min) 45 min    Activity Tolerance Patient tolerated treatment well;No increased pain;Patient limited by fatigue;Patient limited by pain    Behavior During Therapy [facility] for tasks assessed/performed            Past Medical History:  Diagnosis Date   Aftercare following left hip joint replacement surgery 11/17/2015   Anxiety 2014   Arthritis    ASCUS with positive high risk HPV    Depression    Exercise-induced asthma    Migraine with aura    Smoker    Past Surgical History:  Procedure Laterality Date   COLPOSCOPY  11/28/2010   With hx CIN , 2014 LGSIL   TOTAL HIP ARTHROPLASTY  left   TOTAL HIP ARTHROPLASTY Right 05/21/2019   Procedure: RIGHT TOTAL HIP ARTHROPLASTY ANTERIOR APPROACH;  Surgeon: Vernetta Lonni GRADE, MD;  Location: [facility];  Service: Orthopedics;  Laterality: Right;   Patient Active Problem List   Diagnosis Date Noted   Status post total replacement of right hip 05/21/2019   Unilateral primary osteoarthritis, right hip 04/27/2019   Follow-up examination after orthopedic surgery 11/05/2016   Aftercare following left hip joint replacement surgery 11/17/2015   History of total left hip arthroplasty 11/17/2015   Status post left hip replacement 10/31/2015   Migraine 10/18/2015   Primary osteoarthritis of left hip 09/28/2015    ONSET DATE: DOI 02/24/24  REFERRING DIAG: S53.105D (ICD-10-CM) - Dislocation of left elbow, subsequent encounter   THERAPY DIAG:  Muscle weakness (generalized)  Pain in left elbow  Other lack of  coordination  Localized edema  Stiffness of left elbow, not elsewhere classified  Rationale for Evaluation and Treatment: Rehabilitation  PERTINENT HISTORY: Lt THA, OA, asthma, etc.  X-ray on 03/14/24 showed possible non-displaced fx to humeral supracondylar region.   MRI from 03/11/24 shows: MRI left elbow 03/11/24:  IMPRESSION: 1. Osseous contusions of the radial head and capitellum with associated marrow edema. The radiocapitellar and ulnotrochlear joints are aligned. 2. Complete tear of the common forearm flexor tendon origin from the medial humeral epicondyle with up to 2 cm of retraction and joint fluid decompressing into the overlying subcutaneous tissues of the medial elbow. Associated intramuscular edema extending along the proximal forearm flexor myotendinous junction. Marked surrounding soft tissue and subcutaneous edema at the medial elbow. 3. Complete tear of the posterior bundle of the ulnar collateral ligament. High-grade versus near near-complete tear of the anterior bundle of the ulnar collateral ligament. 4. Partial-thickness tear of the common forearm extensor tendon origin from the lateral humeral epicondyle with areas of more pronounced fiber disruption. Associated edema at the myotendinous junction. 5. High-grade partial tear of the radial collateral ligament at the lateral humeral epicondyle origin. Tear of the origin of the lateral ulnar collateral ligament. 6. Moderate-to-large elbow joint effusion. 7. Low-grade sprain of the extensor carpi radialis longus muscle.   PRECAUTIONS: Use caution with any valgus or varus motions of the elbow due to ligament tears, and take care not to  injure common flexor origin further  She fell on a cruise in Dec, had closed reduction of the Lt elbow. She states having exacerbation of pain when having MRI taken recently.  MRI did show multiple significant injuries, but fortunately she has very little pain, is tolerating motion,  has been mild amounts of tenderness and a good attitude.  She works with community education officer, mostly computer based.  She is wearing a hinged elbow brace today that seems to limit full extension by about 15 degrees   RED FLAGS: None   WEIGHT BEARING RESTRICTIONS: Yes recommended no weightbearing more than 5 pounds with her brace on, or anything that is painful to her     SUBJECTIVE:   SUBJECTIVE STATEMENT: She is now 5 weeks s/p Lt elbow dislocation.  She states having sharp Lt shoulder pain with flexion and abduction. She's noticing it more after new sh AAROM.       PAIN:  Are you having pain? Yes: NPRS scale: 0/10 at rest, up to  3-4 /10 at worst in past week  Pain location: Lt medial elbow, lateral elbow Pain description: sore, aching, tight  Aggravating factors: wrist motion, elbow ext  Relieving factors: Rest   PATIENT GOALS: To safely improve her motion and strength with the left arm  NEXT MD VISIT: Approximately 2 weeks  OBJECTIVE: (All objective assessments below are from initial evaluation on: 03/18/24 unless otherwise specified.)   HAND DOMINANCE: Right   ADLs: Overall ADLs: States decreased ability to grab, hold household objects, pain and difficulty to open containers, perform FMS tasks (manipulate fasteners on clothing), mild to moderate bathing problems as well.    FUNCTIONAL OUTCOME MEASURES: Eval: Patient Specific Functional Scale: 2.6 (earrings, hold laundry basket, put up hair)  (Higher Score  =  Better Ability for the Selected Tasks)      UPPER EXTREMITY ROM     Shoulder to Wrist AROM Left eval Lt 03/25/24 Lt 04/01/24  Shoulder flexion WFL but feels stiff    Shoulder abduction     Shoulder extension     Shoulder internal rotation     Shoulder external rotation     Elbow flexion 126 124 124  Elbow extension (-25) (-35) (-10)  Forearm supination 80 78 84  Forearm pronation  83 85 73  Wrist flexion 73    Wrist extension 70    Wrist ulnar deviation      Wrist radial deviation     Functional dart thrower's motion (F-DTM) in ulnar flexion     F-DTM in radial extension      (Blank rows = not tested)   Hand AROM Left eval  Full Fist Ability (or Gap to Distal Palmar Crease) Full fist  Thumb Opposition  (Kapandji Scale)  Full opposition  (Blank rows = not tested)   UPPER EXTREMITY MMT:    Eval:  NT at eval due to recent and still healing injuries. Will be tested when appropriate.   MMT Left TBD  Shoulder flexion   Shoulder abduction   Shoulder adduction   Shoulder extension   Shoulder internal rotation   Shoulder external rotation   Middle trapezius   Lower trapezius   Elbow flexion   Elbow extension   Forearm supination   Forearm pronation   Wrist flexion   Wrist extension   Wrist ulnar deviation   Wrist radial deviation   (Blank rows = not tested)  HAND FUNCTION: Eval: Observed weakness in affected Lt hand.  TBD when safe  Grip  strength Right: TBD lbs, Left: TBD lbs   COORDINATION: 03/25/24: Box and Blocks Test: Left: 51 blocks today (53 is WFL)    EDEMA:   03/25/24:  TBD cm swelling about the Lt elbow crease compared to TBD cm in the right.    OBSERVATIONS:   Eval: Bruising and ecchymosis to the left medial elbow and tenderness there where there is a complete tear.  Tender to palpation on the lateral epicondyle as well.  Not grossly unstable with valgus or varus force today though mildly painful.  Hand and wrist is moving amazingly well with the wrist flexion with slight tension and resistance is painful.  Shoulder is a bit stiff likely from the fall and also wearing the hinged brace with her arm at her side along time.  Left elbow dorsal dislocation and complete tear of common flexor origin, RCL, and partial tears of lateral epicondyle and lateral collateral ligament   TODAY'S TREATMENT:  04/01/24: She starts with active range of motion for exercise as well as new measures which shows excellent improvement in  elbow extension, similar elbow flexion, maintained good wrist and forearm motion.  She discusses her new shoulder pain that she experiences when flexing or abducting her shoulder over 100 degrees, and OT feels that this is likely a sprain or partial tear to the supraspinatus.  It was likely missed because the main injuries to the elbow were so great.  It is fortunate that she is resting and not bearing any weight for her elbow, and she will also rest her shoulder.  OT does give compensatory stretches that she can do passively on the table as well as pendulums that she can work on to help maintain her shoulder and elbow mobility.  OT also upgrades to light wrist stretches which she tolerates without any significant pain.  We continue to review no weightbearing, but she is allowed to move, active assistive, gentle passive range as shown today to her tolerance.  She even tolerated the light isometric grip training which was done not for strengthening but more so in internal stretch to her forearm.  At the end of the session she states understanding all precautions, new exercises, she has no significant pain.   Exercises/activities performed and reviewed today:  (Bolded are new) - Circular Shoulder Pendulum with Table Support  - 4-6 x daily - 1-2 min hold - Seated Bilateral Shoulder Flexion Towel Slide at Table Top  - 3-5 x daily - 1 sets - 3-5 reps - 15 sec hold - Seated Shoulder Abduction Towel Slide at Table Top  - 3-4 x daily - 3-5 reps - 15 hold - Wrist Flexion Stretch  - 4 x daily - 3-5 reps - 15 sec hold - Seated Wrist Extension Stretch and Hold, Wrist Flexor Stretch and Hold  - 4-6 x daily - 1 sets - 10-15 reps - Supine Shoulder Flexion PROM  - 4-6 x daily - 3-5 reps - 15 sec hold - Supine Elbow Flexion Extension AROM  - 4-6 x daily - 1 sets - 10-15 reps - Seated Bicep Curls with Bar  - 4-6 x daily - 10-15 reps - Turn [employer] Facing Up & Down  - 4-6 x daily - 10-15 reps - Bend and Pull Back Wrist  SLOWLY  - 4-6 x daily - 10-15 reps - Windshield Wipers   - 4 x daily - 10-15 reps - Towel Roll Grip with Forearm in Neutral  - 2-3 x daily - 5 reps - 10  sec hold      03/25/24: She starts with active range of motion for exercise as well as new measures which shows a slightly tighter at the elbow and forearm likely due to wearing her brace more often.  This is to be expected and not necessarily a bad thing.  OT tells her that she can carefully monitor her her pain and the frequency of exercises and perhaps get the arm a bit looser with increased frequency as long as pain is remaining low.  We reviewed safety precautions in terms of no significant weightbearing, but light functional activities in the brace and even outside of the brace are encouraged as long as there is no pain at the elbow.  She does box of blocks testing today showing that she is almost in a functional limitation score already.   She then lies supine and OT does retrograde massage to help with swelling and edema, explaining that she could start her workouts with this.  She then reviews gentle active range of motion with a towel under the humerus using forearm rotations.  She then performs gentle gravity assisted active assisted range of motion.  We then move onto dowel rod active assistive range of motion including shoulder flexion and extension, chest presses, and then she performs with a ball to have neutral forearm position.  She tolerates gentle tricep extension against gravity with ball support.  She then does the standing for gentle bicep active range of motion against gravity with gentle isometric bicep squeeze and chest squeeze gently pressing on the ball.  She has no significant pain with these, but rather feels healthy movement and gentle stretches.  Lastly she states she is a bit nervous to do any walking on the treadmill, so OT takes her on the treadmill with her brace on for safety and support and she walks at a comfortable  pace allowing her arm to swing gently without pain.  OT encourages light cardiovascular exercise as this will help her healing and swelling most likely.  She states understanding all directions today, tolerates exercises and activities well and leaves with no significant pain.  PATIENT EDUCATION: Education details: See tx section above for details  Person educated: Patient Education method: Verbal Instruction, Teach back, Handouts  Education comprehension: States and demonstrates understanding, Additional Education required    HOME EXERCISE PROGRAM: Access Code: [email or web address] URL: [email or web address] Date: 04/01/2024 Prepared by: Melvenia Ada   GOALS: Goals reviewed with patient? Yes   SHORT TERM GOALS: (STG required if POC>30 days) Target Date: 04/02/2024  Pt will obtain custom mobilization orthotic. Goal status: TBD/PRN  2.  Pt will demo/state understanding of initial HEP to improve pain levels and prerequisite motion. Goal status: 04/01/2024: Goal met   LONG TERM GOALS: Target Date: 04/30/2024  Pt will improve functional ability by decreased impairment per PSFS assessment from 2.6 to 6.5 or better, for better quality of life. Goal status: INITIAL  2.  Pt will improve grip strength in left hand from unsafe to test to at least 40 lbs for functional use at home and in IADLs. Goal status: INITIAL  3.  Pt will improve A/ROM in left elbow flexion/extension from 126/-26 degrees respectively to at least 150/-5 degrees respectively, to have functional motion for tasks like reach and grasp.  Goal status: INITIAL  4.  Pt will improve strength in left wrist flexion from apparent tender 3+/5 MMT to at least 4+/5 MMT to have increased functional ability to carry out selfcare and higher-level homecare  tasks with less difficulty. Goal status: INITIAL  5.  Pt will improve coordination skills in left arm, as seen by within functional limit score on box and blocks testing to  have increased functional ability to carry out fine motor tasks (fasteners, etc.) and more complex, coordinated IADLs (meal prep, sports, etc.).  Goal status: INITIAL  6.  Pt will keep pain at worst to 3/10 or better to have better sleep and occupational participation in daily roles. Goal status: INITIAL   ASSESSMENT:  CLINICAL IMPRESSION: 04/01/24: She continues to improve her tolerance to activities like gentle stretches and gentle isometric strengthening.  These are good signs of healing.  She is also less tender at the epicondyles.  Unfortunately she did discover that her shoulder and likely supraspinatus may have a injury or even a small tear.  We will compensate for this by doing gentle passive motion and avoid painful ranges of motion.  OT will also send this note to her doctor to make him aware.   PLAN:  OT FREQUENCY: 1x/week  OT DURATION: 6 weeks through 04/30/2024 and up to 7 total visits as needed   PLANNED INTERVENTIONS: 97535 self care/ADL training, 02889 therapeutic exercise, 97530 therapeutic activity, 97112 neuromuscular re-education, 97140 manual therapy, 97035 ultrasound, 97032 electrical stimulation (manual), 97760 Orthotic Initial, [id number] Orthotic/Prosthetic subsequent, compression bandaging, Dry needling, energy conservation, coping strategies training, and patient/family education  CONSULTED AND AGREED WITH PLAN OF CARE: Patient  PLAN FOR NEXT SESSION:   Continue to slowly progress as tolerated  Melvenia Ada, OTR/L, CHT  04/01/2024, 4:39 PM   "

## 2024-04-01 ENCOUNTER — Encounter: Payer: Self-pay | Admitting: Rehabilitative and Restorative Service Providers"

## 2024-04-01 ENCOUNTER — Ambulatory Visit: Admitting: Rehabilitative and Restorative Service Providers"

## 2024-04-01 DIAGNOSIS — M25622 Stiffness of left elbow, not elsewhere classified: Secondary | ICD-10-CM

## 2024-04-01 DIAGNOSIS — M6281 Muscle weakness (generalized): Secondary | ICD-10-CM

## 2024-04-01 DIAGNOSIS — M25522 Pain in left elbow: Secondary | ICD-10-CM | POA: Diagnosis not present

## 2024-04-01 DIAGNOSIS — R600 Localized edema: Secondary | ICD-10-CM | POA: Diagnosis not present

## 2024-04-01 DIAGNOSIS — R278 Other lack of coordination: Secondary | ICD-10-CM

## 2024-04-08 NOTE — Therapy (Signed)
 " OUTPATIENT OCCUPATIONAL THERAPY TREATMENT NOTE  Patient Name: Michelle Ewing MRN: [id number] DOB:11/16/1975, 49 y.o., female Today's Date: 04/09/2024  PCP: N/A REFERRING PROVIDER: Emiliano PARAS. PA-C  END OF SESSION:  OT End of Session - 04/09/24 0935     Visit Number 4    Number of Visits 7    Date for Recertification  04/30/24    Authorization Type BC[employer]    OT Start Time 0935    OT Stop Time 1013    OT Time Calculation (min) 38 min    Activity Tolerance Patient tolerated treatment well;No increased pain;Patient limited by fatigue;Patient limited by pain    Behavior During Therapy [facility] for tasks assessed/performed             Past Medical History:  Diagnosis Date   Aftercare following left hip joint replacement surgery 11/17/2015   Anxiety 2014   Arthritis    ASCUS with positive high risk HPV    Depression    Exercise-induced asthma    Migraine with aura    Smoker    Past Surgical History:  Procedure Laterality Date   COLPOSCOPY  11/28/2010   With hx CIN , 2014 LGSIL   TOTAL HIP ARTHROPLASTY  left   TOTAL HIP ARTHROPLASTY Right 05/21/2019   Procedure: RIGHT TOTAL HIP ARTHROPLASTY ANTERIOR APPROACH;  Surgeon: Vernetta Lonni GRADE, MD;  Location: [facility];  Service: Orthopedics;  Laterality: Right;   Patient Active Problem List   Diagnosis Date Noted   Status post total replacement of right hip 05/21/2019   Unilateral primary osteoarthritis, right hip 04/27/2019   Follow-up examination after orthopedic surgery 11/05/2016   Aftercare following left hip joint replacement surgery 11/17/2015   History of total left hip arthroplasty 11/17/2015   Status post left hip replacement 10/31/2015   Migraine 10/18/2015   Primary osteoarthritis of left hip 09/28/2015    ONSET DATE: DOI 02/24/24  REFERRING DIAG: S53.105D (ICD-10-CM) - Dislocation of left elbow, subsequent encounter   THERAPY DIAG:  Muscle weakness (generalized)  Pain in left elbow  Other lack of  coordination  Localized edema  Stiffness of left elbow, not elsewhere classified  Rationale for Evaluation and Treatment: Rehabilitation  PERTINENT HISTORY: Lt THA, OA, asthma, etc.  X-ray on 03/14/24 showed possible non-displaced fx to humeral supracondylar region.   MRI from 03/11/24 shows: MRI left elbow 03/11/24:  IMPRESSION: 1. Osseous contusions of the radial head and capitellum with associated marrow edema. The radiocapitellar and ulnotrochlear joints are aligned. 2. Complete tear of the common forearm flexor tendon origin from the medial humeral epicondyle with up to 2 cm of retraction and joint fluid decompressing into the overlying subcutaneous tissues of the medial elbow. Associated intramuscular edema extending along the proximal forearm flexor myotendinous junction. Marked surrounding soft tissue and subcutaneous edema at the medial elbow. 3. Complete tear of the posterior bundle of the ulnar collateral ligament. High-grade versus near near-complete tear of the anterior bundle of the ulnar collateral ligament. 4. Partial-thickness tear of the common forearm extensor tendon origin from the lateral humeral epicondyle with areas of more pronounced fiber disruption. Associated edema at the myotendinous junction. 5. High-grade partial tear of the radial collateral ligament at the lateral humeral epicondyle origin. Tear of the origin of the lateral ulnar collateral ligament. 6. Moderate-to-large elbow joint effusion. 7. Low-grade sprain of the extensor carpi radialis longus muscle.   PRECAUTIONS: Use caution with any valgus or varus motions of the elbow due to ligament tears, and take care not  to injure common flexor origin further  She fell on a cruise in Dec, had closed reduction of the Lt elbow. She states having exacerbation of pain when having MRI taken recently.  MRI did show multiple significant injuries, but fortunately she has very little pain, is tolerating motion,  has been mild amounts of tenderness and a good attitude.  She works with community education officer, mostly computer based.  She is wearing a hinged elbow brace today that seems to limit full extension by about 15 degrees   RED FLAGS: None   WEIGHT BEARING RESTRICTIONS: Yes recommended no weightbearing more than 5 pounds with her brace on, or anything that is painful to her     SUBJECTIVE:   SUBJECTIVE STATEMENT: She is now 6 weeks s/p Lt elbow dislocation.  She states she is now not wearing her hinged elbow brace most of the day because she feels less comfortable with it on than off.  She is doing her best to avoid any kind of strain or weight to the left arm, and wrist stretches and hand strengthening have not been painful.  Her shoulder is feeling better as well    PAIN:  Are you having pain? Yes: NPRS scale:  0/10 at rest, up to  2-3 /10 at worst in past week  Pain location: Lt medial elbow, lateral elbow Pain description: sore, aching, tight  Aggravating factors: wrist motion, elbow ext  Relieving factors: Rest   PATIENT GOALS: To safely improve her motion and strength with the left arm  NEXT MD VISIT: Approximately 2 weeks  OBJECTIVE: (All objective assessments below are from initial evaluation on: 03/18/24 unless otherwise specified.)   HAND DOMINANCE: Right   ADLs: Overall ADLs: States decreased ability to grab, hold household objects, pain and difficulty to open containers, perform FMS tasks (manipulate fasteners on clothing), mild to moderate bathing problems as well.    FUNCTIONAL OUTCOME MEASURES: Eval: Patient Specific Functional Scale: 2.6 (earrings, hold laundry basket, put up hair)  (Higher Score  =  Better Ability for the Selected Tasks)      UPPER EXTREMITY ROM     Shoulder to Wrist AROM Left eval Lt 03/25/24 Lt 04/01/24 Lt 04/09/24  Shoulder flexion WFL but feels stiff     Shoulder abduction      Shoulder extension      Shoulder internal rotation      Shoulder  external rotation      Elbow flexion 126 124 124 140  Elbow extension (-25) (-35) (-10) (-11)  Forearm supination 80 78 84 88  Forearm pronation  83 85 73 78  Wrist flexion 73   60  Wrist extension 70   67  Wrist ulnar deviation      Wrist radial deviation      Functional dart thrower's motion (F-DTM) in ulnar flexion      F-DTM in radial extension       (Blank rows = not tested)   Hand AROM Left eval  Full Fist Ability (or Gap to Distal Palmar Crease) Full fist  Thumb Opposition  (Kapandji Scale)  Full opposition  (Blank rows = not tested)   UPPER EXTREMITY MMT:    Eval:  NT at eval due to recent and still healing injuries. Will be tested when appropriate.   MMT Left TBD  Shoulder flexion   Shoulder abduction   Shoulder adduction   Shoulder extension   Shoulder internal rotation   Shoulder external rotation   Middle trapezius   Lower  trapezius   Elbow flexion   Elbow extension   Forearm supination   Forearm pronation   Wrist flexion   Wrist extension   Wrist ulnar deviation   Wrist radial deviation   (Blank rows = not tested)  HAND FUNCTION: 04/09/24: Grip strength Right: 82 lbs, Left: 56  lbs   COORDINATION: 03/25/24: Box and Blocks Test: Left: 51 blocks today (53 is WFL)    EDEMA:   04/09/24:  26.9 cm swelling about the Lt elbow crease compared to 26.5 cm in the right.    OBSERVATIONS:   04/09/24: OT does careful stability testing at the elbow and she appears to be less tender to palpation now and have more stability, however medial epicondyle is still sharply painful and tender with touch    Eval: Bruising and ecchymosis to the left medial elbow and tenderness there where there is a complete tear.  Tender to palpation on the lateral epicondyle as well.  Not grossly unstable with valgus or varus force today though mildly painful.  Hand and wrist is moving amazingly well with the wrist flexion with slight tension and resistance is painful.  Shoulder is a bit  stiff likely from the fall and also wearing the hinged brace with her arm at her side along time.  Left elbow dorsal dislocation and complete tear of common flexor origin, RCL, and partial tears of lateral epicondyle and lateral collateral ligament   TODAY'S TREATMENT:  04/09/24: The patient starts with active range of motion for exercise as well as new measures which shows significant improvements at the elbow, however the wrist is a bit stiffer.. We also review the exercises/activities that were discussed in the previous session, and several new upper body stretches were introduced today.  We do these together to ensure understanding and correct performance.  She is stretching the bicep, tricep, forearm, wrist and she is tolerating these very well.  She states they are not painful.  We do review safety precautions including no weightbearing heavy lifting, fast or repetitive motion or ballistic activities.  She leaves in no significant pain states understanding all directions    Exercises/activities (bolded are new) - Circular Shoulder Pendulum with Table Support  - 4-6 x daily - 1-2 min hold - Seated Bilateral Shoulder Flexion Towel Slide at Table Top  - 3-5 x daily - 1 sets - 3-5 reps - 15 sec hold - Seated Shoulder Abduction Towel Slide at Table Top  - 3-4 x daily - 3-5 reps - 15 hold - Fridge Door Stretch  - 4 x daily - 3-5 reps - 15 sec hold - Triceps Stretch- Do on back of chair   - 3-4 x daily - 3-5 reps - 15 hold - Forearm Pronation Stretch  - 3-4 x daily - 3-5 reps - 15 sec hold - Forearm Supination Stretch  - 3-4 x daily - 3-5 reps - 15 sec hold - elbow flexion STRETCH  - 3-4 x daily - 3-5 reps - 15 sec hold - Seated Elbow PROM Blocked Extension  - 3-4 x daily - 5 reps - 20 sec hold - Wrist Flexion Stretch  - 4 x daily - 3-5 reps - 15 sec hold - Seated Wrist Extension Stretch and Hold, Wrist Flexor Stretch and Hold  - 4-6 x daily - 1 sets - 10-15 reps - Supine Shoulder Flexion PROM  -  4-6 x daily - 3-5 reps - 15 sec hold - Supine Elbow Flexion Extension AROM  - 4-6 x  daily - 1 sets - 10-15 reps - Seated Bicep Curls with Bar  - 4-6 x daily - 10-15 reps - Turn [employer] Facing Up & Down  - 4-6 x daily - 10-15 reps - Bend and Pull Back Wrist SLOWLY  - 4-6 x daily - 10-15 reps - Windshield Wipers   - 4 x daily - 10-15 reps - Towel Roll Grip with Forearm in Neutral  - 2-3 x daily - 5 reps - 10 sec hold     PATIENT EDUCATION: Education details: See tx section above for details  Person educated: Patient Education method: Engineer, Structural, Teach back, Handouts  Education comprehension: States and demonstrates understanding, Additional Education required    HOME EXERCISE PROGRAM: Access Code: [email or web address] URL: [email or web address] Date: 04/01/2024 Prepared by: Melvenia Ada   GOALS: Goals reviewed with patient? Yes   SHORT TERM GOALS: (STG required if POC>30 days) Target Date: 04/02/2024  Pt will obtain custom mobilization orthotic. Goal status: TBD/PRN  2.  Pt will demo/state understanding of initial HEP to improve pain levels and prerequisite motion. Goal status: 04/01/2024: Goal met   LONG TERM GOALS: Target Date: 04/30/2024  Pt will improve functional ability by decreased impairment per PSFS assessment from 2.6 to 6.5 or better, for better quality of life. Goal status: INITIAL  2.  Pt will improve grip strength in left hand from unsafe to test to at least 40 lbs for functional use at home and in IADLs. Goal status: INITIAL  3.  Pt will improve A/ROM in left elbow flexion/extension from 126/-26 degrees respectively to at least 150/-5 degrees respectively, to have functional motion for tasks like reach and grasp.  Goal status: INITIAL  4.  Pt will improve strength in left wrist flexion from apparent tender 3+/5 MMT to at least 4+/5 MMT to have increased functional ability to carry out selfcare and higher-level homecare tasks with less  difficulty. Goal status: INITIAL  5.  Pt will improve coordination skills in left arm, as seen by within functional limit score on box and blocks testing to have increased functional ability to carry out fine motor tasks (fasteners, etc.) and more complex, coordinated IADLs (meal prep, sports, etc.).  Goal status: INITIAL  6.  Pt will keep pain at worst to 3/10 or better to have better sleep and occupational participation in daily roles. Goal status: INITIAL   ASSESSMENT:  CLINICAL IMPRESSION: 04/09/24: She is now tolerating stretches at the proximal and distal attachments of both the biceps and the triceps, as well as forearm stretches wrist stretches, doing very well.  She is tolerating time out of her hinged elbow brace.  These are all excellent signs.  She does need to be very careful not to do any ballistic or heavy activities yet.   04/01/24: She continues to improve her tolerance to activities like gentle stretches and gentle isometric strengthening.  These are good signs of healing.  She is also less tender at the epicondyles.  Unfortunately she did discover that her shoulder and likely supraspinatus may have a injury or even a small tear.  We will compensate for this by doing gentle passive motion and avoid painful ranges of motion.  OT will also send this note to her doctor to make him aware.   PLAN:  OT FREQUENCY: 1x/week  OT DURATION: 6 weeks through 04/30/2024 and up to 7 total visits as needed   PLANNED INTERVENTIONS: 97535 self care/ADL training, 02889 therapeutic exercise, 97530 therapeutic activity, 97112 neuromuscular re-education, 97140 manual therapy,  02964 ultrasound, 02967 electrical stimulation (manual), [id number] Orthotic Initial, [id number] Orthotic/Prosthetic subsequent, compression bandaging, Dry needling, energy conservation, coping strategies training, and patient/family education  CONSULTED AND AGREED WITH PLAN OF CARE: Patient  PLAN FOR NEXT SESSION:   Upgrade to light  isometric strengthening if her doctor's follow-up goes well  Melvenia Ada, OTR/L, CHT  04/09/2024, 10:20 AM   "

## 2024-04-09 ENCOUNTER — Encounter: Payer: Self-pay | Admitting: Rehabilitative and Restorative Service Providers"

## 2024-04-09 ENCOUNTER — Ambulatory Visit: Admitting: Rehabilitative and Restorative Service Providers"

## 2024-04-09 DIAGNOSIS — R600 Localized edema: Secondary | ICD-10-CM

## 2024-04-09 DIAGNOSIS — R278 Other lack of coordination: Secondary | ICD-10-CM

## 2024-04-09 DIAGNOSIS — M25522 Pain in left elbow: Secondary | ICD-10-CM

## 2024-04-09 DIAGNOSIS — M25622 Stiffness of left elbow, not elsewhere classified: Secondary | ICD-10-CM

## 2024-04-09 DIAGNOSIS — M6281 Muscle weakness (generalized): Secondary | ICD-10-CM

## 2024-04-16 ENCOUNTER — Ambulatory Visit (HOSPITAL_BASED_OUTPATIENT_CLINIC_OR_DEPARTMENT_OTHER): Admitting: Orthopaedic Surgery

## 2024-04-16 ENCOUNTER — Encounter: Admitting: Rehabilitative and Restorative Service Providers"

## 2024-04-23 ENCOUNTER — Encounter: Admitting: Rehabilitative and Restorative Service Providers"
